# Patient Record
Sex: Female | Born: 1995 | Race: White | Hispanic: No | Marital: Single | Smoking: Former smoker
Health system: Southern US, Community
[De-identification: ages and names within clinical notes are randomized; demographics above are authoritative.]

## PROBLEM LIST (undated history)

## (undated) ENCOUNTER — Inpatient Hospital Stay (HOSPITAL_COMMUNITY): Payer: Self-pay

## (undated) DIAGNOSIS — F319 Bipolar disorder, unspecified: Secondary | ICD-10-CM

## (undated) DIAGNOSIS — F1210 Cannabis abuse, uncomplicated: Secondary | ICD-10-CM

## (undated) DIAGNOSIS — F909 Attention-deficit hyperactivity disorder, unspecified type: Secondary | ICD-10-CM

## (undated) DIAGNOSIS — F329 Major depressive disorder, single episode, unspecified: Secondary | ICD-10-CM

## (undated) DIAGNOSIS — R45851 Suicidal ideations: Secondary | ICD-10-CM

## (undated) DIAGNOSIS — F419 Anxiety disorder, unspecified: Secondary | ICD-10-CM

## (undated) DIAGNOSIS — F919 Conduct disorder, unspecified: Secondary | ICD-10-CM

## (undated) DIAGNOSIS — F938 Other childhood emotional disorders: Secondary | ICD-10-CM

## (undated) DIAGNOSIS — F32A Depression, unspecified: Secondary | ICD-10-CM

## (undated) DIAGNOSIS — N390 Urinary tract infection, site not specified: Secondary | ICD-10-CM

## (undated) DIAGNOSIS — E669 Obesity, unspecified: Secondary | ICD-10-CM

## (undated) HISTORY — PX: MOUTH SURGERY: SHX715

## (undated) HISTORY — PX: ABDOMINAL SURGERY: SHX537

---
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
## 2017-01-03 ENCOUNTER — Ambulatory Visit (HOSPITAL_COMMUNITY)
Admission: RE | Admit: 2017-01-03 | Discharge: 2017-01-03 | Disposition: A | Discharge status: Home / Self Care | Payer: Medicaid Other | Source: Ambulatory Visit | Attending: Obstetrics and Gynecology | Admitting: Obstetrics and Gynecology

## 2017-01-03 ENCOUNTER — Other Ambulatory Visit (HOSPITAL_COMMUNITY): Payer: Self-pay | Admitting: Obstetrics and Gynecology

## 2017-01-03 ENCOUNTER — Encounter (HOSPITAL_COMMUNITY): Payer: Self-pay

## 2017-01-03 DIAGNOSIS — O43123 Velamentous insertion of umbilical cord, third trimester: Secondary | ICD-10-CM | POA: Insufficient documentation

## 2017-01-03 DIAGNOSIS — O43122 Velamentous insertion of umbilical cord, second trimester: Secondary | ICD-10-CM

## 2017-01-03 DIAGNOSIS — Z363 Encounter for antenatal screening for malformations: Secondary | ICD-10-CM | POA: Insufficient documentation

## 2017-01-03 DIAGNOSIS — Z3A31 31 weeks gestation of pregnancy: Secondary | ICD-10-CM | POA: Diagnosis not present

## 2017-01-03 DIAGNOSIS — O99213 Obesity complicating pregnancy, third trimester: Secondary | ICD-10-CM | POA: Insufficient documentation

## 2017-01-04 ENCOUNTER — Other Ambulatory Visit (HOSPITAL_COMMUNITY): Payer: Self-pay | Admitting: *Deleted

## 2017-01-10 ENCOUNTER — Encounter: Payer: Self-pay | Admitting: Obstetrics and Gynecology

## 2017-01-10 NOTE — Progress Notes (Signed)
Patient did not keep OB referral appointment for 01/10/2017.  Cornelia Copa MD Attending Center for [employer] Midwife)

## 2017-01-11 ENCOUNTER — Encounter: Payer: Medicaid Other | Admitting: Obstetrics & Gynecology

## 2017-01-18 ENCOUNTER — Ambulatory Visit (HOSPITAL_COMMUNITY)
Admission: RE | Admit: 2017-01-18 | Discharge: 2017-01-18 | Disposition: A | Discharge status: Home / Self Care | Payer: Medicaid Other | Source: Ambulatory Visit | Attending: Obstetrics and Gynecology | Admitting: Obstetrics and Gynecology

## 2017-01-18 ENCOUNTER — Encounter (HOSPITAL_COMMUNITY): Payer: Self-pay

## 2017-01-18 ENCOUNTER — Other Ambulatory Visit (HOSPITAL_COMMUNITY): Payer: Self-pay | Admitting: Obstetrics and Gynecology

## 2017-01-18 DIAGNOSIS — O43123 Velamentous insertion of umbilical cord, third trimester: Secondary | ICD-10-CM | POA: Diagnosis present

## 2017-01-18 DIAGNOSIS — Z3A33 33 weeks gestation of pregnancy: Secondary | ICD-10-CM

## 2017-01-18 DIAGNOSIS — E669 Obesity, unspecified: Secondary | ICD-10-CM | POA: Insufficient documentation

## 2017-01-18 DIAGNOSIS — O99213 Obesity complicating pregnancy, third trimester: Secondary | ICD-10-CM | POA: Diagnosis not present

## 2017-02-19 ENCOUNTER — Ambulatory Visit (INDEPENDENT_AMBULATORY_CARE_PROVIDER_SITE_OTHER): Payer: Medicaid Other | Admitting: Obstetrics & Gynecology

## 2017-02-19 ENCOUNTER — Other Ambulatory Visit (HOSPITAL_COMMUNITY)
Admission: RE | Admit: 2017-02-19 | Discharge: 2017-02-19 | Disposition: A | Discharge status: Home / Self Care | Payer: Medicaid Other | Source: Ambulatory Visit | Attending: Obstetrics & Gynecology | Admitting: Obstetrics & Gynecology

## 2017-02-19 DIAGNOSIS — Z3403 Encounter for supervision of normal first pregnancy, third trimester: Secondary | ICD-10-CM | POA: Insufficient documentation

## 2017-02-19 DIAGNOSIS — Z3400 Encounter for supervision of normal first pregnancy, unspecified trimester: Secondary | ICD-10-CM | POA: Insufficient documentation

## 2017-02-19 LAB — POCT URINALYSIS DIP (DEVICE)
Bilirubin Urine: NEGATIVE
GLUCOSE, UA: NEGATIVE mg/dL
Hgb urine dipstick: NEGATIVE
Ketones, ur: NEGATIVE mg/dL
LEUKOCYTES UA: NEGATIVE
NITRITE: NEGATIVE
PROTEIN: 30 mg/dL — AB
Specific Gravity, Urine: 1.025 (ref 1.005–1.030)
UROBILINOGEN UA: 0.2 mg/dL (ref 0.0–1.0)
pH: 6.5 (ref 5.0–8.0)

## 2017-02-19 NOTE — Patient Instructions (Signed)
Vaginal Delivery Vaginal delivery means that you will give birth by pushing your baby out of your birth canal (vagina). A team of health care providers will help you before, during, and after vaginal delivery. Birth experiences are unique for every woman and every pregnancy, and birth experiences vary depending on where you choose to give birth. What should I do to prepare for my baby's birth? Before your baby is born, it is important to talk with your health care provider about:  Your labor and delivery preferences. These may include: ? Medicines that you may be given. ? How you will manage your pain. This might include non-medical pain relief techniques or injectable pain relief such as epidural analgesia. ? How you and your baby will be monitored during labor and delivery. ? Who may be in the labor and delivery room with you. ? Your feelings about surgical delivery of your baby (cesarean delivery, or C-section) if this becomes necessary. ? Your feelings about receiving donated blood through an IV tube (blood transfusion) if this becomes necessary.  Whether you are able: ? To take pictures or videos of the birth. ? To eat during labor and delivery. ? To move around, walk, or change positions during labor and delivery.  What to expect after your baby is born, such as: ? Whether delayed umbilical cord clamping and cutting is offered. ? Who will care for your baby right after birth. ? Medicines or tests that may be recommended for your baby. ? Whether breastfeeding is supported in your hospital or birth center. ? How long you will be in the hospital or birth center.  How any medical conditions you have may affect your baby or your labor and delivery experience.  To prepare for your baby's birth, you should also:  Attend all of your health care visits before delivery (prenatal visits) as recommended by your health care provider. This is important.  Prepare your home for your baby's  arrival. Make sure that you have: ? Diapers. ? Baby clothing. ? Feeding equipment. ? Safe sleeping arrangements for you and your baby.  Install a car seat in your vehicle. Have your car seat checked by a certified car seat installer to make sure that it is installed safely.  Think about who will help you with your new baby at home for at least the first several weeks after delivery.  What can I expect when I arrive at the birth center or hospital? Once you are in labor and have been admitted into the hospital or birth center, your health care provider may:  Review your pregnancy history and any concerns you have.  Insert an IV tube into one of your veins. This is used to give you fluids and medicines.  Check your blood pressure, pulse, temperature, and heart rate (vital signs).  Check whether your bag of water (amniotic sac) has broken (ruptured).  Talk with you about your birth plan and discuss pain control options.  Monitoring Your health care provider may monitor your contractions (uterine monitoring) and your baby's heart rate (fetal monitoring). You may need to be monitored:  Often, but not continuously (intermittently).  All the time or for long periods at a time (continuously). Continuous monitoring may be needed if: ? You are taking certain medicines, such as medicine to relieve pain or make your contractions stronger. ? You have pregnancy or labor complications.  Monitoring may be done by:  Placing a special stethoscope or a handheld monitoring device on your abdomen to   check your baby's heartbeat, and feeling your abdomen for contractions. This method of monitoring does not continuously record your baby's heartbeat or your contractions.  Placing monitors on your abdomen (external monitors) to record your baby's heartbeat and the frequency and length of contractions. You may not have to wear external monitors all the time.  Placing monitors inside of your uterus  (internal monitors) to record your baby's heartbeat and the frequency, length, and strength of your contractions. ? Your health care provider may use internal monitors if he or she needs more information about the strength of your contractions or your baby's heart rate. ? Internal monitors are put in place by passing a thin, flexible wire through your vagina and into your uterus. Depending on the type of monitor, it may remain in your uterus or on your baby's head until birth. ? Your health care provider will discuss the benefits and risks of internal monitoring with you and will ask for your permission before inserting the monitors.  Telemetry. This is a type of continuous monitoring that can be done with external or internal monitors. Instead of having to stay in bed, you are able to move around during telemetry. Ask your health care provider if telemetry is an option for you.  Physical exam Your health care provider may perform a physical exam. This may include:  Checking whether your baby is positioned: ? With the head toward your vagina (head-down). This is most common. ? With the head toward the top of your uterus (head-up or breech). If your baby is in a breech position, your health care provider may try to turn your baby to a head-down position so you can deliver vaginally. If it does not seem that your baby can be born vaginally, your provider may recommend surgery to deliver your baby. In rare cases, you may be able to deliver vaginally if your baby is head-up (breech delivery). ? Lying sideways (transverse). Babies that are lying sideways cannot be delivered vaginally.  Checking your cervix to determine: ? Whether it is thinning out (effacing). ? Whether it is opening up (dilating). ? How low your baby has moved into your birth canal.  What are the three stages of labor and delivery?  Normal labor and delivery is divided into the following three stages: Stage 1  Stage 1 is the  longest stage of labor, and it can last for hours or days. Stage 1 includes: ? Early labor. This is when contractions may be irregular, or regular and mild. Generally, early labor contractions are more than 10 minutes apart. ? Active labor. This is when contractions get longer, more regular, more frequent, and more intense. ? The transition phase. This is when contractions happen very close together, are very intense, and may last longer than during any other part of labor.  Contractions generally feel mild, infrequent, and irregular at first. They get stronger, more frequent (about every 2-3 minutes), and more regular as you progress from early labor through active labor and transition.  Many women progress through stage 1 naturally, but you may need help to continue making progress. If this happens, your health care provider may talk with you about: ? Rupturing your amniotic sac if it has not ruptured yet. ? Giving you medicine to help make your contractions stronger and more frequent.  Stage 1 ends when your cervix is completely dilated to 4 inches (10 cm) and completely effaced. This happens at the end of the transition phase. Stage 2  Once   your cervix is completely effaced and dilated to 4 inches (10 cm), you may start to feel an urge to push. It is common for the body to naturally take a rest before feeling the urge to push, especially if you received an epidural or certain other pain medicines. This rest period may last for up to 1-2 hours, depending on your unique labor experience.  During stage 2, contractions are generally less painful, because pushing helps relieve contraction pain. Instead of contraction pain, you may feel stretching and burning pain, especially when the widest part of your baby's head passes through the vaginal opening (crowning).  Your health care provider will closely monitor your pushing progress and your baby's progress through the vagina during stage 2.  Your  health care provider may massage the area of skin between your vaginal opening and anus (perineum) or apply warm compresses to your perineum. This helps it stretch as the baby's head starts to crown, which can help prevent perineal tearing. ? In some cases, an incision may be made in your perineum (episiotomy) to allow the baby to pass through the vaginal opening. An episiotomy helps to make the opening of the vagina larger to allow more room for the baby to fit through.  It is very important to breathe and focus so your health care provider can control the delivery of your baby's head. Your health care provider may have you decrease the intensity of your pushing, to help prevent perineal tearing.  After delivery of your baby's head, the shoulders and the rest of the body generally deliver very quickly and without difficulty.  Once your baby is delivered, the umbilical cord may be cut right away, or this may be delayed for 1-2 minutes, depending on your baby's health. This may vary among health care providers, hospitals, and birth centers.  If you and your baby are healthy enough, your baby may be placed on your chest or abdomen to help maintain the baby's temperature and to help you bond with each other. Some mothers and babies start breastfeeding at this time. Your health care team will dry your baby and help keep your baby warm during this time.  Your baby may need immediate care if he or she: ? Showed signs of distress during labor. ? Has a medical condition. ? Was born too early (prematurely). ? Had a bowel movement before birth (meconium). ? Shows signs of difficulty transitioning from being inside the uterus to being outside of the uterus. If you are planning to breastfeed, your health care team will help you begin a feeding. Stage 3  The third stage of labor starts immediately after the birth of your baby and ends after you deliver the placenta. The placenta is an organ that develops  during pregnancy to provide oxygen and nutrients to your baby in the womb.  Delivering the placenta may require some pushing, and you may have mild contractions. Breastfeeding can stimulate contractions to help you deliver the placenta.  After the placenta is delivered, your uterus should tighten (contract) and become firm. This helps to stop bleeding in your uterus. To help your uterus contract and to control bleeding, your health care provider may: ? Give you medicine by injection, through an IV tube, by mouth, or through your rectum (rectally). ? Massage your abdomen or perform a vaginal exam to remove any blood clots that are left in your uterus. ? Empty your bladder by placing a thin, flexible tube (catheter) into your bladder. ? Encourage   you to breastfeed your baby. After labor is over, you and your baby will be monitored closely to ensure that you are both healthy until you are ready to go home. Your health care team will teach you how to care for yourself and your baby. This information is not intended to replace advice given to you by your health care provider. Make sure you discuss any questions you have with your health care provider. Document Released: 06/06/2008 Document Revised: 03/17/2016 Document Reviewed: 09/12/2015 Elsevier Interactive Patient Education  2018 Elsevier Inc.  

## 2017-02-20 ENCOUNTER — Encounter: Payer: Self-pay | Admitting: *Deleted

## 2017-02-20 LAB — CERVICOVAGINAL ANCILLARY ONLY
CHLAMYDIA, DNA PROBE: NEGATIVE
Neisseria Gonorrhea: NEGATIVE

## 2017-02-20 LAB — GLUCOSE TOLERANCE, 1 HOUR: Glucose, 1 Hour GTT: 105

## 2017-02-20 LAB — OB RESULTS CONSOLE GBS: STREP GROUP B AG: NEGATIVE

## 2017-02-21 LAB — STREP GP B NAA: Strep Gp B NAA: NEGATIVE

## 2017-02-21 NOTE — Progress Notes (Signed)
Subjective:    Denise Mclaughlin is a 21 y.o. G1P0 3913w5d being seen today for her obstetrical visit. She transfers  After dismissal from another practicePatient reports no complaints. Fetal movement: normal.  Objective:    BP 136/79   Pulse (!) 118   Wt 294 lb 11.2 oz (133.7 kg)   LMP 05/26/2016   Physical Exam  Exam  FHT: Fetal Heart Rate (bpm): 152  Uterine Size: Fundal Height: 36 cm  Presentation:  vtx     Assessment:    Pregnancy:  G1P0    Plan:    Patient Active Problem List   Diagnosis Date Noted  . Supervision of normal first pregnancy, antepartum 02/19/2017  . 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 Suicidal ideation 05/21/2015  . Bipolar and related disorder (HCC) 05/21/2015  . Obesity 05/21/2015  . Cannabis abuse, continuous use 05/21/2015  . Suicidal behavior   . Major depressive disorder, single episode, severe without psychotic features (HCC)   . Acetaminophen overdose 05/18/2015      Follow up in 1 Week.    Adam PhenixArnold, Yazmina Pareja G, MD

## 2017-02-26 ENCOUNTER — Ambulatory Visit (INDEPENDENT_AMBULATORY_CARE_PROVIDER_SITE_OTHER): Payer: Medicaid Other | Admitting: Advanced Practice Midwife

## 2017-02-26 ENCOUNTER — Encounter: Payer: Self-pay | Admitting: Advanced Practice Midwife

## 2017-02-26 VITALS — BP 132/83 | HR 98 | Wt 295.7 lb

## 2017-02-26 DIAGNOSIS — O480 Post-term pregnancy: Secondary | ICD-10-CM

## 2017-02-26 DIAGNOSIS — Z3400 Encounter for supervision of normal first pregnancy, unspecified trimester: Secondary | ICD-10-CM

## 2017-02-26 DIAGNOSIS — Z3483 Encounter for supervision of other normal pregnancy, third trimester: Secondary | ICD-10-CM

## 2017-02-26 NOTE — Patient Instructions (Addendum)
Braxton Hicks Contractions Contractions of the uterus can occur throughout pregnancy, but they are not always a sign that you are in labor. You may have practice contractions called Braxton Hicks contractions. These false labor contractions are sometimes confused with true labor. What are Braxton Hicks contractions? Braxton Hicks contractions are tightening movements that occur in the muscles of the uterus before labor. Unlike true labor contractions, these contractions do not result in opening (dilation) and thinning of the cervix. Toward the end of pregnancy (32-34 weeks), Braxton Hicks contractions can happen more often and may become stronger. These contractions are sometimes difficult to tell apart from true labor because they can be very uncomfortable. You should not feel embarrassed if you go to the hospital with false labor. Sometimes, the only way to tell if you are in true labor is for your health care provider to look for changes in the cervix. The health care provider will do a physical exam and may monitor your contractions. If you are not in true labor, the exam should show that your cervix is not dilating and your water has not broken. If there are no prenatal problems or other health problems associated with your pregnancy, it is completely safe for you to be sent home with false labor. You may continue to have Braxton Hicks contractions until you go into true labor. How can I tell the difference between true labor and false labor?  Differences ? False labor ? Contractions last 30-70 seconds.: Contractions are usually shorter and not as strong as true labor contractions. ? Contractions become very regular.: Contractions are usually irregular. ? Discomfort is usually felt in the top of the uterus, and it spreads to the lower abdomen and low back.: Contractions are often felt in the front of the lower abdomen and in the groin. ? Contractions do not go away with walking.: Contractions may  go away when you walk around or change positions while lying down. ? Contractions usually become more intense and increase in frequency.: Contractions get weaker and are shorter-lasting as time goes on. ? The cervix dilates and gets thinner.: The cervix usually does not dilate or become thin. Follow these instructions at home:  Take over-the-counter and prescription medicines only as told by your health care provider.  Keep up with your usual exercises and follow other instructions from your health care provider.  Eat and drink lightly if you think you are going into labor.  If Braxton Hicks contractions are making you uncomfortable: ? Change your position from lying down or resting to walking, or change from walking to resting. ? Sit and rest in a tub of warm water. ? Drink enough fluid to keep your urine clear or pale yellow. Dehydration may cause these contractions. ? Do slow and deep breathing several times an hour.  Keep all follow-up prenatal visits as told by your health care provider. This is important. Contact a health care provider if:  You have a fever.  You have continuous pain in your abdomen. Get help right away if:  Your contractions become stronger, more regular, and closer together.  You have fluid leaking or gushing from your vagina.  You pass blood-tinged mucus (bloody show).  You have bleeding from your vagina.  You have low back pain that you never had before.  You feel your baby's head pushing down and causing pelvic pressure.  Your baby is not moving inside you as much as it used to. Summary  Contractions that occur before labor are   called Braxton Hicks contractions, false labor, or practice contractions.  Braxton Hicks contractions are usually shorter, weaker, farther apart, and less regular than true labor contractions. True labor contractions usually become progressively stronger and regular and they become more frequent.  Manage discomfort from  Braxton Hicks contractions by changing position, resting in a warm bath, drinking plenty of water, or practicing deep breathing. This information is not intended to replace advice given to you by your health care provider. Make sure you discuss any questions you have with your health care provider. Document Released: 08/28/2005 Document Revised: 07/17/2016 Document Reviewed: 07/17/2016 Elsevier Interactive Patient Education  2017 Elsevier Inc.   AREA PEDIATRIC/FAMILY PRACTICE PHYSICIANS  [facility] [address], [address] [place], [place]  [address] Phone - [phone]   Fax - [phone]  [facility] [address] [place], [place] [address] Phone - [phone]   Fax - [phone]  [facility] [address] [place], [place]  [address] Phone - [phone]   Fax - [phone]  [facility] [address] [place], [place]  [address] Phone - [phone]   Fax - [phone]  [facility] [address] [place], [place]  [address] Phone - [phone]   Fax - [phone]  [facility] [address] [place], [place]  [address] Phone - [phone]   Fax - [phone]  [facility] [address] [place], [place]  [address] Phone - [phone]   Fax - [phone]  [facility] [address] [place], [place]  [address] Phone - [phone]   Fax - [phone]  [facility] [address] [place], [place]  [address] Phone - [phone]   Fax - [phone] [facility] [address] [place], [place]  [address] Phone - [phone]   Fax - [phone]  [facility] [address] [place], [place]  [address] Phone - [phone]   Fax - [phone]  [facility] [address] [place], [place]  [address] Phone - [phone]   Fax - [phone]  [facility] [address] [place], [place]  [address] Phone - [phone]   Fax - [phone]  [facility] [address] [place], [place]  [address] Phone - [phone]  [facility] [address] [place], [place]  [address] Phone - [phone]   Fax - [phone]  [facility] [address] [place], [place]  [address] Phone - [phone]   Fax - [phone]  [facility] [address] [place], [place]  [address] Phone - [phone]   Fax - [phone]  [facility] [address] [place], [place]  [address] Phone - [phone]   Fax - [phone]   [facility] [address] [place], [place]  [address] Phone - [phone]   Fax - [phone]  [facility] [address] [place], [place]  [address] Phone - [phone]   Fax - [phone]  [facility] [address] [place], [place]  [address] Phone - [phone]   Fax - [phone]  [facility] [address] [place], [place]  [address] Phone - [phone]   Fax - [phone]  [facility] [address] ,   [place]  [address] Phone - [phone]   Fax - [phone] [facility] [address] [place], [place]  [address] Phone - [phone]   Fax - [phone]  [facility] [address] [place], [place]  [address] Phone - [phone]   Fax - [phone]  [facility] [address] [place], [place]  [address] Phone - [phone]   Fax - [phone]  [facility] Charlene Flemming MD [address] [place] [address] Phone [phone]  Fax [phone]   

## 2017-02-26 NOTE — Progress Notes (Signed)
   PRENATAL VISIT NOTE  Subjective:  Denise Mclaughlin is a 21 y.o. G1P0 at 6749w3d being seen today for ongoing prenatal care.  She is currently monitored for the following issues for this low-risk pregnancy and has Acetaminophen overdose; Suicidal behavior; Major depressive disorder, single episode, severe without psychotic features (HCC); Anxiety disorder of adolescence; Suicidal ideation; Bipolar and related disorder (HCC); Obesity; Cannabis abuse, continuous use; and Supervision of normal first pregnancy, antepartum on her problem list.  Patient reports no complaints.  Contractions: Irritability. Vag. Bleeding: None.  Movement: Present. Denies leaking of fluid.   The following portions of the patient's history were reviewed and updated as appropriate: allergies, current medications, past family history, past medical history, past social history, past surgical history and problem list. Problem list updated.  Objective:   Vitals:   02/26/17 1340  BP: 132/83  Pulse: 98  Weight: 295 lb 11.2 oz (134.1 kg)    Fetal Status: Fetal Heart Rate (bpm): 140   Movement: Present     General:  Alert, oriented and cooperative. Patient is in no acute distress.  Skin: Skin is warm and dry. No rash noted.   Cardiovascular: Normal heart rate noted  Respiratory: Normal respiratory effort, no problems with respiration noted  Abdomen: Soft, gravid, appropriate for gestational age. Pain/Pressure: Present     Pelvic:  Cervical exam performed        Extremities: Normal range of motion.  Edema: Trace  Mental Status: Normal mood and affect. Normal behavior. Normal judgment and thought content.   Assessment and Plan:  Pregnancy: G1P0 at 3549w3d  1. Supervision of normal first pregnancy, antepartum   2. Post term pregnancy, antepartum condition or complication  - US MFM FETAL BPP WO NON STRESS; Future  Term labor symptoms and general obstetric precautions including but not limited to vaginal bleeding,  contractions, leaking of fluid and fetal movement were reviewed in detail with the patient. Please refer to After Visit Summary for other counseling recommendations.  Return in about 1 week (around 03/05/2017) for ROB/NST/AFI.   Dorathy KinsmanVirginia Thanos [place], CNM

## 2017-03-05 ENCOUNTER — Other Ambulatory Visit: Payer: Medicaid Other | Admitting: Obstetrics and Gynecology

## 2017-03-06 ENCOUNTER — Encounter (HOSPITAL_COMMUNITY): Payer: Self-pay

## 2017-03-06 ENCOUNTER — Ambulatory Visit (INDEPENDENT_AMBULATORY_CARE_PROVIDER_SITE_OTHER): Payer: Medicaid Other | Admitting: Obstetrics and Gynecology

## 2017-03-06 ENCOUNTER — Ambulatory Visit (HOSPITAL_COMMUNITY)
Admission: RE | Admit: 2017-03-06 | Discharge: 2017-03-06 | Disposition: A | Discharge status: Home / Self Care | Payer: Medicaid Other | Source: Ambulatory Visit | Attending: Advanced Practice Midwife | Admitting: Advanced Practice Midwife

## 2017-03-06 ENCOUNTER — Other Ambulatory Visit: Payer: Self-pay | Admitting: Advanced Practice Midwife

## 2017-03-06 VITALS — BP 135/77 | HR 108 | Wt 300.0 lb

## 2017-03-06 DIAGNOSIS — O480 Post-term pregnancy: Secondary | ICD-10-CM | POA: Diagnosis not present

## 2017-03-06 DIAGNOSIS — Z3A40 40 weeks gestation of pregnancy: Secondary | ICD-10-CM | POA: Insufficient documentation

## 2017-03-06 DIAGNOSIS — Z3400 Encounter for supervision of normal first pregnancy, unspecified trimester: Secondary | ICD-10-CM

## 2017-03-06 DIAGNOSIS — Z3403 Encounter for supervision of normal first pregnancy, third trimester: Secondary | ICD-10-CM

## 2017-03-06 NOTE — Patient Instructions (Signed)
Third Trimester of Pregnancy The third trimester is from week 28 through week 40 (months 7 through 9). The third trimester is a time when the unborn baby (fetus) is growing rapidly. At the end of the ninth month, the fetus is about 20 inches in length and weighs 6-10 pounds. Body changes during your third trimester Your body will continue to go through many changes during pregnancy. The changes vary from woman to woman. During the third trimester:  Your weight will continue to increase. You can expect to gain 25-35 pounds (11-16 kg) by the end of the pregnancy.  You may begin to get stretch marks on your hips, abdomen, and breasts.  You may urinate more often because the fetus is moving lower into your pelvis and pressing on your bladder.  You may develop or continue to have heartburn. This is caused by increased hormones that slow down muscles in the digestive tract.  You may develop or continue to have constipation because increased hormones slow digestion and cause the muscles that push waste through your intestines to relax.  You may develop hemorrhoids. These are swollen veins (varicose veins) in the rectum that can itch or be painful.  You may develop swollen, bulging veins (varicose veins) in your legs.  You may have increased body aches in the pelvis, back, or thighs. This is due to weight gain and increased hormones that are relaxing your joints.  You may have changes in your hair. These can include thickening of your hair, rapid growth, and changes in texture. Some women also have hair loss during or after pregnancy, or hair that feels dry or thin. Your hair will most likely return to normal after your baby is born.  Your breasts will continue to grow and they will continue to become tender. A yellow fluid (colostrum) may leak from your breasts. This is the first milk you are producing for your baby.  Your belly button may stick out.  You may notice more swelling in your hands,  face, or ankles.  You may have increased tingling or numbness in your hands, arms, and legs. The skin on your belly may also feel numb.  You may feel short of breath because of your expanding uterus.  You may have more problems sleeping. This can be caused by the size of your belly, increased need to urinate, and an increase in your body's metabolism.  You may notice the fetus "dropping," or moving lower in your abdomen (lightening).  You may have increased vaginal discharge.  You may notice your joints feel loose and you may have pain around your pelvic bone.  What to expect at prenatal visits You will have prenatal exams every 2 weeks until week 36. Then you will have weekly prenatal exams. During a routine prenatal visit:  You will be weighed to make sure you and the baby are growing normally.  Your blood pressure will be taken.  Your abdomen will be measured to track your baby's growth.  The fetal heartbeat will be listened to.  Any test results from the previous visit will be discussed.  You may have a cervical check near your due date to see if your cervix has softened or thinned (effaced).  You will be tested for Group B streptococcus. This happens between 35 and 37 weeks.  Your health care provider may ask you:  What your birth plan is.  How you are feeling.  If you are feeling the baby move.  If you have had   any abnormal symptoms, such as leaking fluid, bleeding, severe headaches, or abdominal cramping.  If you are using any tobacco products, including cigarettes, chewing tobacco, and electronic cigarettes.  If you have any questions.  Other tests or screenings that may be performed during your third trimester include:  Blood tests that check for low iron levels (anemia).  Fetal testing to check the health, activity level, and growth of the fetus. Testing is done if you have certain medical conditions or if there are problems during the  pregnancy.  Nonstress test (NST). This test checks the health of your baby to make sure there are no signs of problems, such as the baby not getting enough oxygen. During this test, a belt is placed around your belly. The baby is made to move, and its heart rate is monitored during movement.  What is false labor? False labor is a condition in which you feel small, irregular tightenings of the muscles in the womb (contractions) that usually go away with rest, changing position, or drinking water. These are called Braxton Hicks contractions. Contractions may last for hours, days, or even weeks before true labor sets in. If contractions come at regular intervals, become more frequent, increase in intensity, or become painful, you should see your health care provider. What are the signs of labor?  Abdominal cramps.  Regular contractions that start at 10 minutes apart and become stronger and more frequent with time.  Contractions that start on the top of the uterus and spread down to the lower abdomen and back.  Increased pelvic pressure and dull back pain.  A watery or bloody mucus discharge that comes from the vagina.  Leaking of amniotic fluid. This is also known as your "water breaking." It could be a slow trickle or a gush. Let your health care provider know if it has a color or strange odor. If you have any of these signs, call your health care provider right away, even if it is before your due date. Follow these instructions at home: Medicines  Follow your health care provider's instructions regarding medicine use. Specific medicines may be either safe or unsafe to take during pregnancy.  Take a prenatal vitamin that contains at least 600 micrograms (mcg) of folic acid.  If you develop constipation, try taking a stool softener if your health care provider approves. Eating and drinking  Eat a balanced diet that includes fresh fruits and vegetables, whole grains, good sources of protein  such as meat, eggs, or tofu, and low-fat dairy. Your health care provider will help you determine the amount of weight gain that is right for you.  Avoid raw meat and uncooked cheese. These carry germs that can cause birth defects in the baby.  If you have low calcium intake from food, talk to your health care provider about whether you should take a daily calcium supplement.  Eat four or five small meals rather than three large meals a day.  Limit foods that are high in fat and processed sugars, such as fried and sweet foods.  To prevent constipation: ? Drink enough fluid to keep your urine clear or pale yellow. ? Eat foods that are high in fiber, such as fresh fruits and vegetables, whole grains, and beans. Activity  Exercise only as directed by your health care provider. Most women can continue their usual exercise routine during pregnancy. Try to exercise for 30 minutes at least 5 days a week. Stop exercising if you experience uterine contractions.  Avoid heavy   lifting.  Do not exercise in extreme heat or humidity, or at high altitudes.  Wear low-heel, comfortable shoes.  Practice good posture.  You may continue to have sex unless your health care provider tells you otherwise. Relieving pain and discomfort  Take frequent breaks and rest with your legs elevated if you have leg cramps or low back pain.  Take warm sitz baths to soothe any pain or discomfort caused by hemorrhoids. Use hemorrhoid cream if your health care provider approves.  Wear a good support bra to prevent discomfort from breast tenderness.  If you develop varicose veins: ? Wear support pantyhose or compression stockings as told by your healthcare provider. ? Elevate your feet for 15 minutes, 3-4 times a day. Prenatal care  Write down your questions. Take them to your prenatal visits.  Keep all your prenatal visits as told by your health care provider. This is important. Safety  Wear your seat belt at  all times when driving.  Make a list of emergency phone numbers, including numbers for family, friends, the hospital, and police and fire departments. General instructions  Avoid cat litter boxes and soil used by cats. These carry germs that can cause birth defects in the baby. If you have a cat, ask someone to clean the litter box for you.  Do not travel far distances unless it is absolutely necessary and only with the approval of your health care provider.  Do not use hot tubs, steam rooms, or saunas.  Do not drink alcohol.  Do not use any products that contain nicotine or tobacco, such as cigarettes and e-cigarettes. If you need help quitting, ask your health care provider.  Do not use any medicinal herbs or unprescribed drugs. These chemicals affect the formation and growth of the baby.  Do not douche or use tampons or scented sanitary pads.  Do not cross your legs for long periods of time.  To prepare for the arrival of your baby: ? Take prenatal classes to understand, practice, and ask questions about labor and delivery. ? Make a trial run to the hospital. ? Visit the hospital and tour the maternity area. ? Arrange for maternity or paternity leave through employers. ? Arrange for family and friends to take care of pets while you are in the hospital. ? Purchase a rear-facing car seat and make sure you know how to install it in your car. ? Pack your hospital bag. ? Prepare the baby's nursery. Make sure to remove all pillows and stuffed animals from the baby's crib to prevent suffocation.  Visit your dentist if you have not gone during your pregnancy. Use a soft toothbrush to brush your teeth and be gentle when you floss. Contact a health care provider if:  You are unsure if you are in labor or if your water has broken.  You become dizzy.  You have mild pelvic cramps, pelvic pressure, or nagging pain in your abdominal area.  You have lower back pain.  You have persistent  nausea, vomiting, or diarrhea.  You have an unusual or bad smelling vaginal discharge.  You have pain when you urinate. Get help right away if:  Your water breaks before 37 weeks.  You have regular contractions less than 5 minutes apart before 37 weeks.  You have a fever.  You are leaking fluid from your vagina.  You have spotting or bleeding from your vagina.  You have severe abdominal pain or cramping.  You have rapid weight loss or weight gain.    You have shortness of breath with chest pain.  You notice sudden or extreme swelling of your face, hands, ankles, feet, or legs.  Your baby makes fewer than 10 movements in 2 hours.  You have severe headaches that do not go away when you take medicine.  You have vision changes. Summary  The third trimester is from week 28 through week 40, months 7 through 9. The third trimester is a time when the unborn baby (fetus) is growing rapidly.  During the third trimester, your discomfort may increase as you and your baby continue to gain weight. You may have abdominal, leg, and back pain, sleeping problems, and an increased need to urinate.  During the third trimester your breasts will keep growing and they will continue to become tender. A yellow fluid (colostrum) may leak from your breasts. This is the first milk you are producing for your baby.  False labor is a condition in which you feel small, irregular tightenings of the muscles in the womb (contractions) that eventually go away. These are called Braxton Hicks contractions. Contractions may last for hours, days, or even weeks before true labor sets in.  Signs of labor can include: abdominal cramps; regular contractions that start at 10 minutes apart and become stronger and more frequent with time; watery or bloody mucus discharge that comes from the vagina; increased pelvic pressure and dull back pain; and leaking of amniotic fluid. This information is not intended to replace advice  given to you by your health care provider. Make sure you discuss any questions you have with your health care provider. Document Released: 08/22/2001 Document Revised: 02/03/2016 Document Reviewed: 10/29/2012 Elsevier Interactive Patient Education  2017 [employer]  

## 2017-03-06 NOTE — Progress Notes (Signed)
   PRENATAL VISIT NOTE  Subjective:  Denise Mclaughlin is a 21 y.o. G1P0 at 9343w4d being seen today for ongoing prenatal care.  She is currently monitored for the following issues for this low-risk pregnancy and has Acetaminophen overdose; Suicidal behavior; Major depressive disorder, single episode, severe without psychotic features (HCC); Anxiety disorder of adolescence; Suicidal ideation; Bipolar and related disorder (HCC); Obesity; Cannabis abuse, continuous use; and Supervision of normal first pregnancy, antepartum on her problem list.  Patient reports no complaints.  Contractions: Irritability. Vag. Bleeding: None.  Movement: Present. Denies leaking of fluid.   The following portions of the patient's history were reviewed and updated as appropriate: allergies, current medications, past family history, past medical history, past social history, past surgical history and problem list. Problem list updated.  Objective:   Vitals:   03/06/17 1525  BP: 135/77  Pulse: (!) 108  Weight: 300 lb (136.1 kg)    Fetal Status: Fetal Heart Rate (bpm): NST   Movement: Present     General:  Alert, oriented and cooperative. Patient is in no acute distress.  Skin: Skin is warm and dry. No rash noted.   Cardiovascular: Normal heart rate noted  Respiratory: Normal respiratory effort, no problems with respiration noted  Abdomen: Soft, gravid, appropriate for gestational age. Pain/Pressure: Present     Pelvic:  Cervical exam deferred        Extremities: Normal range of motion.  Edema: Mild pitting, slight indentation  Mental Status: Normal mood and affect. Normal behavior. Normal judgment and thought content.   Assessment and Plan:  Pregnancy: G1P0 at 7643w4d  1. Supervision of normal first pregnancy, antepartum -normal pregnacy up todadate -set for direct admission at 9AM at 41 wks. No available induction slot until 41 and 6.  2. Post-term pregnancy, 40-42 weeks of gestation - Fetal nonstress test  baseline 155/mod var/ reactive no decels or contractions.  Term labor symptoms and general obstetric precautions including but not limited to vaginal bleeding, contractions, leaking of fluid and fetal movement were reviewed in detail with the patient. Please refer to After Visit Summary for other counseling recommendations.  No Follow-up on file.   Ernestina PennaNicholas Schenk, MD

## 2017-03-09 ENCOUNTER — Encounter (HOSPITAL_COMMUNITY): Payer: Self-pay | Admitting: *Deleted

## 2017-03-09 ENCOUNTER — Inpatient Hospital Stay (HOSPITAL_COMMUNITY)
Admission: AD | Admit: 2017-03-09 | Discharge: 2017-03-12 | DRG: 775 | Disposition: A | Discharge status: Home / Self Care | Payer: Medicaid Other | Source: Ambulatory Visit | Attending: Obstetrics and Gynecology | Admitting: Obstetrics and Gynecology

## 2017-03-09 ENCOUNTER — Inpatient Hospital Stay (HOSPITAL_COMMUNITY): Payer: Medicaid Other | Admitting: Anesthesiology

## 2017-03-09 DIAGNOSIS — O480 Post-term pregnancy: Principal | ICD-10-CM | POA: Diagnosis present

## 2017-03-09 DIAGNOSIS — O99214 Obesity complicating childbirth: Secondary | ICD-10-CM | POA: Diagnosis present

## 2017-03-09 DIAGNOSIS — Z68.41 Body mass index (BMI) pediatric, 85th percentile to less than 95th percentile for age: Secondary | ICD-10-CM | POA: Diagnosis not present

## 2017-03-09 DIAGNOSIS — Z3490 Encounter for supervision of normal pregnancy, unspecified, unspecified trimester: Secondary | ICD-10-CM

## 2017-03-09 DIAGNOSIS — Z3A41 41 weeks gestation of pregnancy: Secondary | ICD-10-CM

## 2017-03-09 DIAGNOSIS — O43123 Velamentous insertion of umbilical cord, third trimester: Secondary | ICD-10-CM | POA: Diagnosis present

## 2017-03-09 DIAGNOSIS — Z87891 Personal history of nicotine dependence: Secondary | ICD-10-CM | POA: Diagnosis not present

## 2017-03-09 LAB — TYPE AND SCREEN
ABO/RH(D): A POS
ANTIBODY SCREEN: NEGATIVE

## 2017-03-09 LAB — CBC
HCT: 31.9 % — ABNORMAL LOW (ref 36.0–46.0)
HEMATOCRIT: 34.8 % — AB (ref 36.0–46.0)
HEMOGLOBIN: 11.1 g/dL — AB (ref 12.0–15.0)
HEMOGLOBIN: 10.6 g/dL — AB (ref 12.0–15.0)
MCH: 25.6 pg — ABNORMAL LOW (ref 26.0–34.0)
MCH: 26.4 pg (ref 26.0–34.0)
MCHC: 31.9 g/dL (ref 30.0–36.0)
MCHC: 33.2 g/dL (ref 30.0–36.0)
MCV: 80.4 fL (ref 78.0–100.0)
MCV: 79.4 fL (ref 78.0–100.0)
PLATELETS: 168 10*3/uL (ref 150–400)
Platelets: 185 10*3/uL (ref 150–400)
RBC: 4.33 MIL/uL (ref 3.87–5.11)
RBC: 4.02 MIL/uL (ref 3.87–5.11)
RDW: 14.8 % (ref 11.5–15.5)
RDW: 14.7 % (ref 11.5–15.5)
WBC: 12 10*3/uL — AB (ref 4.0–10.5)
WBC: 10.8 10*3/uL — AB (ref 4.0–10.5)

## 2017-03-09 LAB — COMPREHENSIVE METABOLIC PANEL
ALK PHOS: 281 U/L — AB (ref 38–126)
ALT: 12 U/L — AB (ref 14–54)
ANION GAP: 6 (ref 5–15)
AST: 17 U/L (ref 15–41)
Albumin: 2.3 g/dL — ABNORMAL LOW (ref 3.5–5.0)
BUN: 10 mg/dL (ref 6–20)
CALCIUM: 8.5 mg/dL — AB (ref 8.9–10.3)
CHLORIDE: 109 mmol/L (ref 101–111)
CO2: 21 mmol/L — AB (ref 22–32)
CREATININE: 0.51 mg/dL (ref 0.44–1.00)
GFR calc Af Amer: >60 mL/min (ref >60)
GFR calc non Af Amer: >60 mL/min (ref >60)
GLUCOSE: 103 mg/dL — AB (ref 65–99)
Potassium: 4.1 mmol/L (ref 3.5–5.1)
SODIUM: 136 mmol/L (ref 135–145)
Total Bilirubin: 0.2 mg/dL — ABNORMAL LOW (ref 0.3–1.2)
Total Protein: 5.5 g/dL — ABNORMAL LOW (ref 6.5–8.1)

## 2017-03-09 LAB — PROTEIN / CREATININE RATIO, URINE
Creatinine, Urine: 136 mg/dL
PROTEIN CREATININE RATIO: 0.45 mg/mg{creat} — AB (ref 0.00–0.15)
TOTAL PROTEIN, URINE: 61 mg/dL

## 2017-03-09 LAB — ABO/RH: ABO/RH(D): A POS

## 2017-03-09 MED ORDER — DIPHENHYDRAMINE HCL 50 MG/ML IJ SOLN: 12.5000 mg | INTRAMUSCULAR | Status: DC | PRN

## 2017-03-09 MED ORDER — LACTATED RINGERS IV SOLN: 500.0000 mL | INTRAVENOUS | Status: DC | PRN

## 2017-03-09 MED ORDER — OXYTOCIN BOLUS FROM INFUSION
500.0000 mL | Freq: Once | INTRAVENOUS | Status: AC
  Administered 2017-03-10: 500 mL via INTRAVENOUS

## 2017-03-09 MED ORDER — OXYCODONE-ACETAMINOPHEN 5-325 MG PO TABS: 2.0000 | ORAL_TABLET | ORAL | Status: DC | PRN

## 2017-03-09 MED ORDER — PHENYLEPHRINE 40 MCG/ML (10ML) SYRINGE FOR IV PUSH (FOR BLOOD PRESSURE SUPPORT)
80.0000 ug | PREFILLED_SYRINGE | INTRAVENOUS | Status: DC | PRN
  Filled 2017-03-09: qty 5

## 2017-03-09 MED ORDER — SOD CITRATE-CITRIC ACID 500-334 MG/5ML PO SOLN: 30.0000 mL | ORAL | Status: DC | PRN

## 2017-03-09 MED ORDER — OXYCODONE-ACETAMINOPHEN 5-325 MG PO TABS: 1.0000 | ORAL_TABLET | ORAL | Status: DC | PRN

## 2017-03-09 MED ORDER — LACTATED RINGERS IV SOLN
500.0000 mL | Freq: Once | INTRAVENOUS | Status: AC
  Administered 2017-03-09: 500 mL via INTRAVENOUS

## 2017-03-09 MED ORDER — OXYTOCIN 40 UNITS IN LACTATED RINGERS INFUSION - SIMPLE MED
1.0000 m[IU]/min | INTRAVENOUS | Status: DC
  Administered 2017-03-09: 2 m[IU]/min via INTRAVENOUS
  Filled 2017-03-09: qty 1000

## 2017-03-09 MED ORDER — TERBUTALINE SULFATE 1 MG/ML IJ SOLN
0.2500 mg | Freq: Once | INTRAMUSCULAR | Status: DC | PRN
  Filled 2017-03-09: qty 1

## 2017-03-09 MED ORDER — FENTANYL 2.5 MCG/ML BUPIVACAINE 1/10 % EPIDURAL INFUSION (WH - ANES)
14.0000 mL/h | INTRAMUSCULAR | Status: DC | PRN
  Administered 2017-03-09 – 2017-03-10 (×2): 14 mL/h via EPIDURAL
  Filled 2017-03-09 (×2): qty 100

## 2017-03-09 MED ORDER — LIDOCAINE HCL (PF) 1 % IJ SOLN
30.0000 mL | INTRAMUSCULAR | Status: AC | PRN
  Administered 2017-03-10: 30 mL via SUBCUTANEOUS
  Filled 2017-03-09: qty 30

## 2017-03-09 MED ORDER — EPHEDRINE 5 MG/ML INJ
10.0000 mg | INTRAVENOUS | Status: DC | PRN
  Filled 2017-03-09: qty 2

## 2017-03-09 MED ORDER — FENTANYL CITRATE (PF) 100 MCG/2ML IJ SOLN
100.0000 ug | INTRAMUSCULAR | Status: DC | PRN
  Administered 2017-03-09: 100 ug via INTRAVENOUS
  Filled 2017-03-09: qty 2

## 2017-03-09 MED ORDER — PHENYLEPHRINE 40 MCG/ML (10ML) SYRINGE FOR IV PUSH (FOR BLOOD PRESSURE SUPPORT)
80.0000 ug | PREFILLED_SYRINGE | INTRAVENOUS | Status: DC | PRN
  Filled 2017-03-09: qty 10
  Filled 2017-03-09: qty 5

## 2017-03-09 MED ORDER — LIDOCAINE HCL (PF) 1 % IJ SOLN
INTRAMUSCULAR | Status: DC | PRN
  Administered 2017-03-09 (×2): 5 mL

## 2017-03-09 MED ORDER — OXYTOCIN 40 UNITS IN LACTATED RINGERS INFUSION - SIMPLE MED: 2.5000 [IU]/h | INTRAVENOUS | Status: DC

## 2017-03-09 MED ORDER — LACTATED RINGERS IV SOLN
INTRAVENOUS | Status: DC
  Administered 2017-03-09 – 2017-03-10 (×4): via INTRAVENOUS

## 2017-03-09 MED ORDER — ACETAMINOPHEN 325 MG PO TABS
650.0000 mg | ORAL_TABLET | ORAL | Status: DC | PRN
  Administered 2017-03-09: 650 mg via ORAL
  Filled 2017-03-09: qty 2

## 2017-03-09 MED ORDER — ONDANSETRON HCL 4 MG/2ML IJ SOLN
4.0000 mg | Freq: Four times a day (QID) | INTRAMUSCULAR | Status: DC | PRN
Start: 2017-03-09 — End: 2017-03-10

## 2017-03-09 NOTE — Anesthesia Preprocedure Evaluation (Signed)
Anesthesia Evaluation  Patient identified by MRN, date of birth, ID band Patient awake    Reviewed: Allergy & Precautions, H&P , NPO status , Patient's Chart, lab work & pertinent test results  History of Anesthesia Complications Negative for: history of anesthetic complications  Airway Mallampati: II  TM Distance: >3 FB Neck ROM: full    Dental no notable dental hx. (+) Teeth Intact   Pulmonary former smoker,    Pulmonary exam normal breath sounds clear to auscultation       Cardiovascular negative cardio ROS Normal cardiovascular exam Rhythm:regular Rate:Normal     Neuro/Psych Anxiety Depression Bipolar Disorder negative neurological ROS     GI/Hepatic negative GI ROS, Neg liver ROS,   Endo/Other  Morbid obesity  Renal/GU negative Renal ROS  negative genitourinary   Musculoskeletal   Abdominal   Peds  Hematology negative hematology ROS (+)   Anesthesia Other Findings   Reproductive/Obstetrics (+) Pregnancy                             Anesthesia Physical Anesthesia Plan  ASA: III  Anesthesia Plan: Epidural   Post-op Pain Management:    Induction:   PONV Risk Score and Plan:   Airway Management Planned:   Additional Equipment:   Intra-op Plan:   Post-operative Plan:   Informed Consent: I have reviewed the patients History and Physical, chart, labs and discussed the procedure including the risks, benefits and alternatives for the proposed anesthesia with the patient or authorized representative who has indicated his/her understanding and acceptance.     Plan Discussed with:   Anesthesia Plan Comments:         Anesthesia Quick Evaluation

## 2017-03-09 NOTE — H&P (Signed)
LABOR AND DELIVERY ADMISSION HISTORY AND PHYSICAL NOTE  Denise Mclaughlin is a 21 y.o. female G1P0 with IUP at 3662w0d by LMP presenting for IOL for postdates. She reports +FM, + contractions, No LOF, no VB, no blurry vision, headaches or peripheral edema, and RUQ pain.  She plans on breast feeding. She request mirena for birth control.  Prenatal History/Complications:  Past Medical History: Past Medical History:  Diagnosis Date  . ADHD (attention deficit hyperactivity disorder)   . Anxiety   . 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 Bipolar and related disorder (HCC) 05/21/2015  . Bipolar disorder (HCC)   . Cannabis abuse, continuous use 05/21/2015  . Conduct disorder 05/21/2015  . Depression   . Obesity 05/21/2015  . Suicidal ideation 05/21/2015  . Urinary tract infection     Past Surgical History: Past Surgical History:  Procedure Laterality Date  . ABDOMINAL SURGERY    . MOUTH SURGERY      Obstetrical History: OB History    Gravida Para Term Preterm AB Living   1         0   SAB TAB Ectopic Multiple Live Births                  Social History: Social History   Social History  . Marital status: Single    Spouse name: N/A  . Number of children: N/A  . Years of education: N/A   Social History Main Topics  . Smoking status: Former Smoker    Types: Cigarettes    Quit date: 09/12/2016  . Smokeless tobacco: Never Used  . Alcohol use No  . Drug use: Yes    Types: Marijuana, Benzodiazepines     Comment: quit with +UPT  . Sexual activity: Yes    Birth control/ protection: None   Other Topics Concern  . None   Social History Narrative   Lives with Dad, & sister. 1 dog at home. Both parents smoke; pt states she used to smoke cigarettes.    Family History: Family History  Problem Relation Age of Onset  . Hypertension Father   . Depression Mother     Allergies: No Known Allergies  Prescriptions Prior to Admission  Medication Sig Dispense Refill Last Dose  .  calcium carbonate (TUMS - DOSED IN MG ELEMENTAL CALCIUM) 500 MG chewable tablet Chew 1 tablet by mouth daily as needed for indigestion or heartburn.   Past Month at Unknown time  . Prenatal Multivit-Min-Fe-FA (PRENATAL VITAMINS PO) Take 1 tablet by mouth daily.    Past Week at Unknown time     Review of Systems   All systems reviewed and negative except as stated in HPI  Physical Exam Blood pressure 132/62, pulse (!) 101, resp. rate 18, height 5\' 6"  (1.676 m), weight (!) 137 kg (302 lb), last menstrual period 05/26/2016. General appearance: alert, cooperative and appears stated age  Presentation: cephalic Fetal monitoring: 145 bmp, mod var, accels, variable decels Uterine activity: irregular/irritability Dilation: 3.5 Station: -3 Exam by:: Dr. Mosetta PuttFeng   Prenatal labs: ABO, Rh: A/Positive/-- (01/09 0000) Antibody: Negative (01/09 0000) Rubella: Immune RPR: Nonreactive (03/27 0000)  HBsAg: Negative, Negative (01/09 0000)  HIV: Non-reactive (03/27 0000)  GBS: Negative (06/12 0000)  1 hr Glucola: normal Genetic screening:  Quad neg Anatomy US: normal female, velamentous cord insertion with concern for funic presentation, resolved on follow up ultrasound  Prenatal Transfer Tool  Maternal Diabetes: No Genetic Screening: Normal Maternal Ultrasounds/Referrals: Normal Fetal Ultrasounds or other Referrals:  None Maternal Substance Abuse:  History of THC and benzo abuse in 2016, no positive UDS this pregnancy Significant Maternal Medications:  None Significant Maternal Lab Results: Lab values include: Group B Strep negative  No results found for this or any previous visit (from the past 24 hour(s)).  Patient Active Problem List   Diagnosis Date Noted  . Supervision of normal first pregnancy, antepartum 02/19/2017  . 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 Suicidal ideation 05/21/2015  . Bipolar and related disorder (HCC) 05/21/2015  . Obesity 05/21/2015  . Cannabis abuse,  continuous use 05/21/2015  . Suicidal behavior   . Major depressive disorder, single episode, severe without psychotic features (HCC)   . Acetaminophen overdose 05/18/2015    Assessment: Denise Mclaughlin is a 21 y.o. G1P0 at [redacted]w[redacted]d here for IOL for postdates  #Labor: pitocin once fetal heart monitoring appears more reassuring #Pain: Planning epidural #FWB: Category II for mild variables - CNM notified, nurse repositioning patient #ID:  GBS negative #MOF: breast #MOC: mirena #Circ:  outpt ([facility])  Howard Pouch, MD PGY-1 [facility] 03/09/2017, 11:37 AM  I confirm that I have verified the information documented in the resident's note and that I have also personally reperformed the physical exam and all medical decision making activities.  Raelyn Mora, CNM 03/09/2017 3:54 PM

## 2017-03-09 NOTE — Anesthesia Pain Management Evaluation Note (Signed)
  [facility] Pain Management Visit Note  Patient: Denise Mclaughlin, 21 y.o., female  "Hello I am a member of the anesthesia team at [facility]. We have an anesthesia team available at all times to provide care throughout the hospital, including epidural management and anesthesia for C-section. I don't know your plan for the delivery whether it a natural birth, water birth, IV sedation, nitrous supplementation, doula or epidural, but we want to meet your pain goals."   1.Was your pain managed to your expectations on prior hospitalizations?   No prior hospitalizations  2.What is your expectation for pain management during this hospitalization?     Epidural  3.How can we help you reach that goal? epidural  Record the patient's initial score and the patient's pain goal.   Pain: 6  Pain Goal: 8 [facility] wants you to be able to say your pain was always managed very well.  Denise Mclaughlin 03/09/2017

## 2017-03-09 NOTE — Progress Notes (Signed)
Subjective: Patient seen and examined for progress of labor. Patient comfortable.   Objective: Vitals:   03/09/17 2006 03/09/17 2031 03/09/17 2101 03/09/17 2105  BP: (!) 158/75 (!) 149/87 (!) 150/74   Pulse: 91 97 97   Resp: 18 (P) 18 (P) 18 (P) 18  Temp: 98.1 F (36.7 C)     TempSrc: Oral     SpO2:      Weight:      Height:       FHT:  FHR: 150 bpm, variability: moderate,  accelerations:  Present,  decelerations:  Absent UC:   irregular, every 4-8 minutes SVE:  Dilation: 6  Effacement: 80%  Station: -2  Exam by: C. Marielys Trinidad SNM Labs: Lab Results  Component Value Date   WBC 10.8 (H) 03/09/2017   HGB 10.6 (L) 03/09/2017   HCT 31.9 (L) 03/09/2017   MCV 79.4 03/09/2017   PLT 168 03/09/2017   Assessment / Plan: IUP at 41 weeks. IOL PD. GBS neg.  Discussed with patient risks and benefits of AROM and placement of internal monitors to assist with tracing FHR and contraction pattern. Patient agreeable with plan.   AROM with small amount of light meconium stained fluid. IUPC and FSE placed without difficulty. Patient tolerated procedure well.  Plan: monitor FHR and contraction pattern. Restart pitocin 2x2 in 30 minutes if FHR reassuring.  Cleone SlimCaroline Marsa Matteo SNM 03/09/2017, 9:17 PM

## 2017-03-09 NOTE — Anesthesia Procedure Notes (Signed)
Epidural Patient location during procedure: OB  Staffing Anesthesiologist: Phillips GroutARIGNAN, Denise Mclaughlin Performed: anesthesiologist   Preanesthetic Checklist Completed: patient identified, site marked, surgical consent, pre-op evaluation, timeout performed, IV checked, risks and benefits discussed and monitors and equipment checked  Epidural Patient position: sitting Prep: DuraPrep Patient monitoring: heart rate, continuous pulse ox and blood pressure Approach: midline Location: L4-L5 Injection technique: LOR saline  Needle:  Needle type: Tuohy  Needle gauge: 17 G Needle length: 9 cm and 9 Needle insertion depth: 10 cm Catheter type: closed end flexible Catheter size: 20 Guage Catheter at skin depth: 15 cm Test dose: negative  Assessment Events: blood not aspirated, injection not painful, no injection resistance, negative IV test and no paresthesia  Additional Notes Patient identified. Risks/Benefits/Options discussed with patient including but not limited to bleeding, infection, nerve damage, paralysis, failed block, incomplete pain control, headache, blood pressure changes, nausea, vomiting, reactions to medication both or allergic, itching and postpartum back pain. Confirmed with bedside nurse the patient's most recent platelet count. Confirmed with patient that they are not currently taking any anticoagulation, have any bleeding history or any family history of bleeding disorders. Patient expressed understanding and wished to proceed. All questions were answered. Sterile technique was used throughout the entire procedure. Please see nursing notes for vital signs. Test dose was given through epidural needle and negative prior to continuing to dose epidural or start infusion. Warning signs of high block given to the patient including shortness of breath, tingling/numbness in hands, complete motor block, or any concerning symptoms with instructions to call for help. Patient was given instructions  on fall risk and not to get out of bed. All questions and concerns addressed with instructions to call with any issues.

## 2017-03-10 ENCOUNTER — Encounter (HOSPITAL_COMMUNITY): Payer: Self-pay | Admitting: Family Medicine

## 2017-03-10 DIAGNOSIS — Z3A41 41 weeks gestation of pregnancy: Secondary | ICD-10-CM

## 2017-03-10 DIAGNOSIS — O480 Post-term pregnancy: Secondary | ICD-10-CM

## 2017-03-10 LAB — RPR: RPR Ser Ql: NONREACTIVE

## 2017-03-10 MED ORDER — COCONUT OIL OIL: 1.0000 "application " | TOPICAL_OIL | Status: DC | PRN

## 2017-03-10 MED ORDER — SENNOSIDES-DOCUSATE SODIUM 8.6-50 MG PO TABS
2.0000 | ORAL_TABLET | ORAL | Status: DC
  Administered 2017-03-10 – 2017-03-11 (×2): 2 via ORAL
  Filled 2017-03-10 (×2): qty 2

## 2017-03-10 MED ORDER — DIPHENHYDRAMINE HCL 25 MG PO CAPS: 25.0000 mg | ORAL_CAPSULE | Freq: Four times a day (QID) | ORAL | Status: DC | PRN

## 2017-03-10 MED ORDER — BENZOCAINE-MENTHOL 20-0.5 % EX AERO
1.0000 "application " | INHALATION_SPRAY | CUTANEOUS | Status: DC | PRN
  Administered 2017-03-10 – 2017-03-12 (×2): 1 via TOPICAL
  Filled 2017-03-10 (×2): qty 56

## 2017-03-10 MED ORDER — IBUPROFEN 600 MG PO TABS
600.0000 mg | ORAL_TABLET | Freq: Four times a day (QID) | ORAL | Status: DC
  Administered 2017-03-10 – 2017-03-12 (×9): 600 mg via ORAL
  Filled 2017-03-10 (×9): qty 1

## 2017-03-10 MED ORDER — DIBUCAINE 1 % RE OINT: 1.0000 "application " | TOPICAL_OINTMENT | RECTAL | Status: DC | PRN

## 2017-03-10 MED ORDER — METHYLERGONOVINE MALEATE 0.2 MG/ML IJ SOLN: 0.2000 mg | INTRAMUSCULAR | Status: DC | PRN

## 2017-03-10 MED ORDER — ACETAMINOPHEN 325 MG PO TABS
650.0000 mg | ORAL_TABLET | ORAL | Status: DC | PRN
Start: 2017-03-10 — End: 2017-03-12
  Administered 2017-03-10 – 2017-03-11 (×3): 650 mg via ORAL
  Filled 2017-03-10 (×3): qty 2

## 2017-03-10 MED ORDER — ONDANSETRON HCL 4 MG/2ML IJ SOLN: 4.0000 mg | INTRAMUSCULAR | Status: DC | PRN

## 2017-03-10 MED ORDER — AMLODIPINE BESYLATE 5 MG PO TABS
5.0000 mg | ORAL_TABLET | Freq: Every day | ORAL | Status: DC
  Administered 2017-03-10: 5 mg via ORAL
  Filled 2017-03-10 (×2): qty 1

## 2017-03-10 MED ORDER — OXYCODONE HCL 5 MG PO TABS: 10.0000 mg | ORAL_TABLET | ORAL | Status: DC | PRN

## 2017-03-10 MED ORDER — MEASLES, MUMPS & RUBELLA VAC ~~LOC~~ INJ: 0.5000 mL | INJECTION | Freq: Once | SUBCUTANEOUS | Status: DC

## 2017-03-10 MED ORDER — METHYLERGONOVINE MALEATE 0.2 MG PO TABS: 0.2000 mg | ORAL_TABLET | ORAL | Status: DC | PRN

## 2017-03-10 MED ORDER — ZOLPIDEM TARTRATE 5 MG PO TABS: 5.0000 mg | ORAL_TABLET | Freq: Every evening | ORAL | Status: DC | PRN

## 2017-03-10 MED ORDER — SIMETHICONE 80 MG PO CHEW: 80.0000 mg | CHEWABLE_TABLET | ORAL | Status: DC | PRN

## 2017-03-10 MED ORDER — TETANUS-DIPHTH-ACELL PERTUSSIS 5-2.5-18.5 LF-MCG/0.5 IM SUSP: 0.5000 mL | Freq: Once | INTRAMUSCULAR | Status: DC

## 2017-03-10 MED ORDER — ONDANSETRON HCL 4 MG PO TABS: 4.0000 mg | ORAL_TABLET | ORAL | Status: DC | PRN

## 2017-03-10 MED ORDER — WITCH HAZEL-GLYCERIN EX PADS: 1.0000 "application " | MEDICATED_PAD | CUTANEOUS | Status: DC | PRN

## 2017-03-10 MED ORDER — OXYCODONE HCL 5 MG PO TABS
5.0000 mg | ORAL_TABLET | ORAL | Status: DC | PRN
  Administered 2017-03-10 – 2017-03-11 (×3): 5 mg via ORAL
  Filled 2017-03-10 (×3): qty 1

## 2017-03-10 MED ORDER — PRENATAL MULTIVITAMIN CH
1.0000 | ORAL_TABLET | Freq: Every day | ORAL | Status: DC
  Administered 2017-03-11 – 2017-03-12 (×2): 1 via ORAL
  Filled 2017-03-10 (×3): qty 1

## 2017-03-10 NOTE — Lactation Note (Signed)
This note was copied from a baby's chart. Lactation Consultation Note  Patient Name: Denise Melton AlarBrittany Palau Today's Date: 03/10/2017 Reason for consult: Initial assessment Called by Nursery RN to see Mom. Baby rooting and not able to latch due to flat nipples. RN hand expressed few drops of colostrum into baby's mouth [name]. Baby now asleep STS on Mom. Mom does have flat nipples that are compressible but become more flat with breast compression. Advised Mom to use hand pump/hand expression prior to latch to see if this will make latch easier. Advised to wear breast shells. Basic teaching reviewed with Mom, encouraged to offer breast with feeding ques. Lactation brochure left for review, advised of OP services and support group. LC left phone number for Mom to call with next feeding for assist.   Maternal Data Has patient been taught Hand Expression?: Yes Does the patient have breastfeeding experience prior to this delivery?: No  Feeding Feeding Type: Breast Fed Length of feed:  (drops)  LATCH Score/Interventions                      Lactation Tools Discussed/Used Tools: Pump;Shells Shell Type: Inverted Breast pump type: Manual WIC Program: Yes   Consult Status Consult Status: Follow-up Date: 03/10/17 Follow-up type: In-patient    [name], Brookie Wayment Ann 03/10/2017, 8:55 AM

## 2017-03-10 NOTE — Progress Notes (Signed)
Patients blood pressure elevated 143/95.  Dr. Omer JackMumaw notified.  Dr. Charline BillsStated she was aware of elevated BP since pts admission.  Will continue to monitor for PIH symptoms.  Pt with no symptoms at this time.

## 2017-03-10 NOTE — Lactation Note (Signed)
This note was copied from a baby's chart. Lactation Consultation Note  Patient Name: Denise Melton AlarBrittany Goda ZOXWR'UToday's Date: 03/10/2017 Reason for consult: Follow-up assessment;Difficult latch Mom called for assist with latch. Pre-pumped right breast, Mom received some colostrum. LC attempted to latch baby using breast compression, nipple very flat and goes inward with breast compression making latch even more difficult. Baby took few suckles but not on nipple well. Initiated 24 nipple shield and baby was able to latch using the nipple shield. Scant amount of colostrum visible in nipple shield with feedings. Reviewed nipple shield use with Mom and advised Mom that RN would be setting her up with DEBP to start pumping every 3 hours to prepare nipples for baby to latch and to encourage milk production. Mom will need further assist with using nipple shield due to her nipple type. Advised RN of initiation of nipple shield and Mom's need for further assist with latching baby. RN will set up DEBP for Mom. Encouraged to call for assist as needed.   Maternal Data Has patient been taught Hand Expression?: Yes Does the patient have breastfeeding experience prior to this delivery?: No  Feeding Feeding Type: Breast Fed Length of feed:  (drops)  LATCH Score/Interventions Latch: Repeated attempts needed to sustain latch, nipple held in mouth throughout feeding, stimulation needed to elicit sucking reflex. (initiated 24 nipple shield) Intervention(s): Adjust position;Assist with latch;Breast massage;Breast compression  Audible Swallowing: A few with stimulation  Type of Nipple: Flat Intervention(s): Shells;Hand pump  Comfort (Breast/Nipple): Soft / non-tender     Hold (Positioning): Assistance needed to correctly position infant at breast and maintain latch.  LATCH Score: 6  Lactation Tools Discussed/Used Tools: Shells;Nipple [name];Pump Nipple shield size: 24 Shell Type: Inverted Breast pump type:  Manual WIC Program: Yes   Consult Status Consult Status: Follow-up Date: 03/11/17 Follow-up type: In-patient    [name], [name] 03/10/2017, 10:24 AM

## 2017-03-10 NOTE — Anesthesia Postprocedure Evaluation (Signed)
Anesthesia Post Note  Patient: [place] Southern Territories[place]ittany Anguiano  Procedure(s) Performed: * No procedures listed *     Patient location during evaluation: Mother Baby Anesthesia Type: Epidural Level of consciousness: awake and alert Pain management: pain level controlled Vital Signs Assessment: post-procedure vital signs reviewed and stable Respiratory status: spontaneous breathing, nonlabored ventilation and respiratory function stable Cardiovascular status: stable Postop Assessment: no headache, no backache and epidural receding Anesthetic complications: no    Last Vitals:  Vitals:   03/10/17 0920 03/10/17 1300  BP: (!) 148/71 (!) 143/95  Pulse: (!) 109 (!) 108  Resp: 18 18  Temp: 37.3 C 36.9 C    Last Pain:  Vitals:   03/10/17 1437  TempSrc:   PainSc: 7    Pain Goal: Patients Stated Pain Goal: 0 (03/09/17 2101)               Junious SilkGILBERT,Eliott Amparan

## 2017-03-11 NOTE — Clinical Social Work Maternal (Signed)
  CLINICAL SOCIAL WORK MATERNAL/CHILD NOTE  Patient Details  Name: Denise Mclaughlin MRN: [id number] Date of Birth: 12-08-1995  Date:  03/11/2017  Clinical Social Worker Initiating Note:  Ferdinand Lango Shameer Molstad, MSW, LCSW-A  Date/ Time Initiated:  03/11/17/1157     Child's Name:  Denise Mclaughlin.    Legal Guardian:  Other (Comment) (Not established by court system; MOB and FOB Debbe Bales Sr. DOB 06/23/1999 parent child collectively )   Need for Interpreter:  None   Date of Referral:  03/10/17     Reason for Referral:  Current Substance Use/Substance Use During Pregnancy , Other (Comment) (MOB hx of Bipolar, anxiety, SI )   Referral Source:  RN   Address:  [place], [place] [address]  Phone number:  [phone]   Household Members:  Self, Parents   Natural Supports (not living in the home):  Spouse/significant other, Friends, Extended Family   Professional Supports: None   Employment: Unemployed   Type of Work: [employer]  Education:  Database administrator Resources:  [employer]   Other Resources:  [facility]   Cultural/Religious Considerations Which May Impact Care:  per Counsellor   Strengths:  Ability to meet basic needs , Compliance with medical plan , Home prepared for child , Pediatrician chosen  ([place] )   Risk Factors/Current Problems:  Substance Use , Mental Health Concerns    Cognitive State:  Able to Concentrate , Alert , Insightful , Goal Oriented    Mood/Affect:  Calm , Comfortable , Happy , Interested    CSW Assessment: CSW met with MOB at bedside to complete assessment for consult regarding hx of Bipoar, anxiety, SI and THC use. Upon this writers arrival, MOB was laying in bed while baby was asleep in basinet. This Probation officer explained role and reasoning for visit. MOB verbalized understanding and was warm and welcoming. This Probation officer inquired about MOB's behavioral health hx. MOB confirms she has been dx with bipolar,  depression and anxiety; however, it was when she was younger and in high school. MOB notes she had a rough time back then but has since improved her coping skill process and does not require medication. This Probation officer inquired if MOB is interested in receiving behavioral health resources for follow-up. MOB notes she is not as she feels she is doing well and has a great support system. This Probation officer observed MOB to have several supports at the bedside earlier before completing assessment and all seem very supportive of MOB and baby. MOB denies SI/HI/AH with this Probation officer at this time.   CSW inquired about MOB's hx of THC use. MOB notes she used recreationally only prior to pregnancy being confirmed. MOB further notes that her last use was 8+ months ago. CSW informed MOB of hospitals policy and procedure regarding substance use and mandated reporting for positive results. MOB verbalized understanding. This Probation officer informed MOB that baby's results have not come back; however, if positive she will be notified. At this time, no other needs addressed or requested. CSW has no barriers to d/c at this time.   CSW Plan/Description:  Information/Referral to [employer] , [employer] , No Further Intervention Required/No Barriers to Discharge, Other (Comment) (CSW will continue to follow pending UDS and CDS results and make a report for positive screens. )   Ferdinand Lango Jilberto Vanderwall, MSW, [facility]  Office: [phone]

## 2017-03-11 NOTE — Progress Notes (Signed)
Pt called out requesting got formula for infant. In to see pt and address concerns. Pt stated infant is fussy and hungry, feed infant with 5cc for breast milk. Pt also stated she tried pumping and did not get any milk this time. Pt educated and encouraged to pump every 3hrs, attempt to latch infant to call for help to latch, and hand express. Encouraged pt to pump again for 15-20. Pt pumped and did not get any milk and insist on formula. Formula given.

## 2017-03-11 NOTE — Progress Notes (Signed)
POSTPARTUM PROGRESS NOTE  Post Partum Day 1 Subjective:  Denise Mclaughlin is a 21 y.o. G1P1001 728w1d s/p nsvd.  No acute events overnight.  Pt denies problems with ambulating, voiding or po intake.  She denies nausea or vomiting.  Pain is well controlled.  She has had flatus. She has not had bowel movement.  Lochia Small.   Objective: Blood pressure 107/62, pulse (!) 102, temperature 98.2 F (36.8 C), temperature source Oral, resp. rate 20, height 5\' 6"  (1.676 m), weight (!) 302 lb (137 kg), last menstrual period 05/26/2016, SpO2 99 %, unknown if currently breastfeeding.  Physical Exam:  General: alert, cooperative and no distress Lochia:normal flow Chest: CTAB Heart: RRR no m/r/g Abdomen: +BS, soft, nontender,  Uterine Fundus: firm,  DVT Evaluation: No calf swelling or tenderness Extremities: trace edema   Recent Labs  03/09/17 0950 03/09/17 1725  HGB 11.1* 10.6*  HCT 34.8* 31.9*    Assessment/Plan:  ASSESSMENT: Denise Mclaughlin is a 21 y.o. G1P1001 7628w1d s/p nsvd, doing well. No ha, vision change, or upper abdominal pain. bp low normal this morning, will d/c and monitor bp next 24 hours, hopefully d/c tomorrow w/o meds  Plan for discharge tomorrow   LOS: 2 days   Silvano Bilisoah B Chyler Creely 03/11/2017, 6:47 AM

## 2017-03-11 NOTE — Lactation Note (Signed)
This note was copied from a baby's chart. Lactation Consultation Note  Mom's feeding intention at admission says "breastmilk w/ [mainly bottle feeding]." In light of this and infrequent pumping, I asked RN to switch her to regular formula to ensure lactose intake.   Lurline HareRichey, Letesha Klecker [facility] 03/11/2017, 10:51 PM

## 2017-03-12 NOTE — Lactation Note (Signed)
This note was copied from a baby's chart. Lactation Consultation Note  Patient Name: Boy Melton AlarBrittany Feuerborn KGMWN'UToday's Date: 03/12/2017 Reason for consult: Follow-up assessment;Difficult latch   Follow up with mom of 51 hour old infant. Infant with 8 formula feeds via bottle of 10-30 ml, 2 voids and 4 stool in last 24 hours.   Mom reports she has difficulty latching infant to breast die to inverted nipples. She is using a NS. She is planning to use formula until her milk is in. She reports her breasts do not feel fuller today. [facility], mom declined and wants to use a manual pump [facility]. She is not pumping regularly. Advised mom to take pump tubing home. Enc mom to pump 8-12 x in 24 hours to promote and protect milk supply. Engorgement prevention/treatment and breast milk handling and storage reviewed.   Maternal Data Does the patient have breastfeeding experience prior to this delivery?: No  Feeding Feeding Type: Bottle Fed - Formula Nipple Type: Slow - flow  LATCH Score/Interventions                      Lactation Tools Discussed/Used WIC Program: Yes Pump Review: Setup, frequency, and cleaning;Milk Storage Initiated by:: Reviewed and encouraged   Consult Status Consult Status: Complete Follow-up type: Call as needed    Ed BlalockSharon S Keimani Laufer 03/12/2017, 9:17 AM

## 2017-03-12 NOTE — Discharge Summary (Signed)
OB Discharge Summary     Patient Name: Denise Mclaughlin DOB: 12/19/1995 MRN: [id number]  Date of admission: 03/09/2017 Delivering MD: Denise Mclaughlin   Date of discharge: 03/12/2017  Admitting diagnosis: 41wks, induction Intrauterine pregnancy: 3976w1d     Secondary diagnosis:  Active Problems:   Pregnancy  Additional problems:  Patient Active Problem List   Diagnosis Date Noted  . Pregnancy 03/09/2017  . Supervision of normal first pregnancy, antepartum 02/19/2017  . 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 Suicidal ideation 05/21/2015  . Bipolar and related disorder (HCC) 05/21/2015  . Obesity 05/21/2015  . Cannabis abuse, continuous use 05/21/2015  . Suicidal behavior   . Major depressive disorder, single episode, severe without psychotic features (HCC)   . Acetaminophen overdose 05/18/2015        Discharge diagnosis: Term Pregnancy Delivered                                                                                                Post partum procedures:none  Augmentation: Pitocin  Complications: None  Hospital course:  Induction of Labor With Vaginal Delivery   21 y.o. yo G1P1001 at 4676w1d was admitted to the hospital 03/09/2017 for induction of labor.  Indication for induction: Postdates.  Patient had an uncomplicated labor course as follows: Membrane Rupture Time/Date: 9:05 PM ,03/09/2017   Intrapartum Procedures: Episiotomy:                                           Lacerations:  2nd degree [3];Sulcus [9]  Patient had delivery of a Viable infant.  Information for the patient's newborn:  Denise Mclaughlin, Boy [name] [[id number]][[id number]]  Delivery Method: Vaginal, Spontaneous Delivery (Filed from Delivery Summary)   03/10/2017  Details of delivery can be found in separate delivery note.  Patient had a routine postpartum course. Patient is discharged home 03/12/17.  Physical exam  Vitals:   03/11/17 0559 03/11/17 1223 03/11/17 1749 03/12/17 0518  BP: 107/62 (!) 141/83  130/80 (!) 121/51  Pulse: (!) 102 (!) 123 (!) 113 (!) 109  Resp: 20  19 20   Temp: 98.2 F (36.8 C)  98.4 F (36.9 C) 98.7 F (37.1 C)  TempSrc: Oral  Oral Oral  SpO2:   98%   Weight:      Height:       General: alert, cooperative and no distress Lochia: appropriate Uterine Fundus: firm Incision: N/A DVT Evaluation: No evidence of DVT seen on physical exam. Labs: Lab Results  Component Value Date   WBC 10.8 (H) 03/09/2017   HGB 10.6 (L) 03/09/2017   HCT 31.9 (L) 03/09/2017   MCV 79.4 03/09/2017   PLT 168 03/09/2017   CMP Latest Ref Rng & Units 03/09/2017  Glucose 65 - 99 mg/dL 865(H103(H)  BUN 6 - 20 mg/dL 10  Creatinine 8.460.44 - 9.621.00 mg/dL 9.520.51  Sodium 841135 - 324145 mmol/L 136  Potassium 3.5 - 5.1 mmol/L 4.1  Chloride 101 - 111 mmol/L 109  CO2 22 - 32  mmol/L 21(L)  Calcium 8.9 - 10.3 mg/dL 4.0(J)  Total Protein 6.5 - 8.1 g/dL 8.1(X)  Total Bilirubin 0.3 - 1.2 mg/dL 9.1(Y)  Alkaline Phos 38 - 126 U/L 281(H)  AST 15 - 41 U/L 17  ALT 14 - 54 U/L 12(L)    Discharge instruction: per After Visit Summary and "Baby and Me Booklet".  After visit meds:  Allergies as of 03/12/2017   No Known Allergies     Medication List    You have not been prescribed any medications.     Diet: routine diet  Activity: Advance as tolerated. Pelvic rest for 6 weeks.   Outpatient follow up:6 weeks Follow up Appt: Future Appointments Date Time Provider Department Center  04/16/2017 1:20 PM Marylene Land, CNM [facility]   Follow up Visit:No Follow-up on file.  Postpartum contraception: Nexplanon  Newborn Data: Live born female  Birth Weight: 6 lb 13 oz (3090 g) APGAR: 8, 9  Baby Feeding: Breast Disposition:home with mother   03/12/2017 Marylene Land, CNM

## 2017-04-16 ENCOUNTER — Ambulatory Visit (INDEPENDENT_AMBULATORY_CARE_PROVIDER_SITE_OTHER): Payer: Medicaid Other | Admitting: Student

## 2017-04-16 ENCOUNTER — Encounter: Payer: Self-pay | Admitting: Student

## 2017-04-16 DIAGNOSIS — Z1389 Encounter for screening for other disorder: Secondary | ICD-10-CM | POA: Diagnosis not present

## 2017-04-16 LAB — POCT PREGNANCY, URINE: Preg Test, Ur: NEGATIVE

## 2017-04-16 MED ORDER — NORELGESTROMIN-ETH ESTRADIOL 150-35 MCG/24HR TD PTWK: 1.0000 | MEDICATED_PATCH | TRANSDERMAL | 12 refills | Status: DC

## 2017-04-16 NOTE — Progress Notes (Signed)
Subjective:     Denise Mclaughlin is a 21 y.o. female who presents for a postpartum visit. She is 5 weeks postpartum following a spontaneous vaginal delivery. I have fully reviewed the prenatal and intrapartum course. The delivery was at  gestational weeks. Outcome: spontaneous vaginal delivery. Anesthesia: epidural. Postpartum course has been uneventful Baby's course has been uneventful. Baby is feeding by bottle - Similac Advance. Bleeding no bleeding. Bowel function is normal. Bladder function is normal. Patient is sexually active. Contraception method is Ship brokerrtho-Evra patches weekly. Postpartum depression screening: negative.    Review of Systems Pertinent items are noted in HPI.   Objective:    BP 112/87   Pulse 94   Wt 269 lb 12.8 oz (122.4 kg)   BMI 43.55 kg/m   General:  alert, cooperative and no distress   Breasts:  inspection negative, no nipple discharge or bleeding, no masses or nodularity palpable  Lungs: clear to auscultation bilaterally  Heart:  regular rate and rhythm, S1, S2 normal, no murmur, click, rub or gallop  Abdomen: soft, non-tender; bowel sounds normal; no masses,  no organomegaly   Vulva:  2nd degree laceration well-healed. Sulcus repair well-healed.   Vagina: normal vagina  Cervix:  not examined.   Corpus: not examined  Adnexa:  normal adnexa  Rectal Exam: Not performed.        Assessment:    Healthy postpartum exam. Pap smear not done at today's visit.   Plan:    1. Contraception: Ortho-Evra patches weekly 2. Patient's UPT is negative, but patient has had unprotected sex 1 week ago. Patient strongly desires her birth control today. Patient will take another pregnancy test in one week and then fill her RX for patch if it is negative. If pregnancy test is positive, patient will call clinic for appt.  3. Follow up in: 1 year or as needed.    Denise Mclaughlin

## 2017-04-17 NOTE — Patient Instructions (Signed)

## 2017-09-11 NOTE — L&D Delivery Note (Signed)
Progressed w/o augmentation, 10 minute 2nd stage  Delivery Note At 11:48 PM a viable female was delivered via Vaginal, Spontaneous (Presentation: LOA  ).  APGAR: 8/9, ; weight  Pending. After 1 minute, the cord was clamped and cut. 40 units of pitocin diluted in 1000cc LR was infused rapidly IV.  The placenta separated spontaneously and delivered via CCT and maternal pushing effort.  It was inspected and appears to be intact with a 3 VC.   Anesthesia:  epidural Episiotomy:   Lacerations:  1st degree perineal Suture Repair: 2.0 vicryl Est. Blood Loss (mL): 382  Mom to postpartum.  Baby to Couplet care / Skin to Skin.   Assisted Para Denise Mclaughlin (med student) in delivery and 3rd stage management Denise Mclaughlin 04/05/2018, 12:15 AM      Please schedule this patient for PP visit in: 1 week High [facility] Delivery mode:  SVD Anticipated Birth Control:  Nexplanon PP Procedures needed: BP check  Schedule Integrated BH visit: yes Provider: Any provider .

## 2017-09-21 ENCOUNTER — Ambulatory Visit (INDEPENDENT_AMBULATORY_CARE_PROVIDER_SITE_OTHER): Payer: Medicaid Other | Admitting: General Practice

## 2017-09-21 DIAGNOSIS — Z3201 Encounter for pregnancy test, result positive: Secondary | ICD-10-CM | POA: Diagnosis not present

## 2017-09-21 LAB — POCT PREGNANCY, URINE: Preg Test, Ur: POSITIVE — AB

## 2017-09-21 NOTE — Progress Notes (Signed)
Patient here for upt today. UPT +. Patient reports first positive home test December 20. LMP 07/27/17 EDD 05/03/18 8 weeks. Patient denies taking any meds or vitamins. Recommended she begin PNV and OB care. Patient verbalized understanding & had no questions

## 2017-10-17 ENCOUNTER — Other Ambulatory Visit (HOSPITAL_COMMUNITY)
Admission: RE | Admit: 2017-10-17 | Discharge: 2017-10-17 | Disposition: A | Discharge status: Home / Self Care | Payer: Medicaid Other | Source: Ambulatory Visit | Attending: Certified Nurse Midwife | Admitting: Certified Nurse Midwife

## 2017-10-17 ENCOUNTER — Encounter: Payer: Self-pay | Admitting: Certified Nurse Midwife

## 2017-10-17 ENCOUNTER — Ambulatory Visit (INDEPENDENT_AMBULATORY_CARE_PROVIDER_SITE_OTHER): Payer: Medicaid Other | Admitting: Certified Nurse Midwife

## 2017-10-17 VITALS — BP 116/79 | HR 112 | Wt 291.0 lb

## 2017-10-17 DIAGNOSIS — Z3480 Encounter for supervision of other normal pregnancy, unspecified trimester: Secondary | ICD-10-CM

## 2017-10-17 DIAGNOSIS — B9689 Other specified bacterial agents as the cause of diseases classified elsewhere: Secondary | ICD-10-CM | POA: Diagnosis not present

## 2017-10-17 DIAGNOSIS — Z3481 Encounter for supervision of other normal pregnancy, first trimester: Secondary | ICD-10-CM

## 2017-10-17 DIAGNOSIS — K219 Gastro-esophageal reflux disease without esophagitis: Secondary | ICD-10-CM

## 2017-10-17 DIAGNOSIS — O99611 Diseases of the digestive system complicating pregnancy, first trimester: Secondary | ICD-10-CM

## 2017-10-17 DIAGNOSIS — Z9889 Other specified postprocedural states: Secondary | ICD-10-CM

## 2017-10-17 MED ORDER — OMEPRAZOLE 20 MG PO CPDR: 20.0000 mg | DELAYED_RELEASE_CAPSULE | Freq: Two times a day (BID) | ORAL | 5 refills | Status: AC

## 2017-10-17 MED ORDER — PRENATE PIXIE 10-0.6-0.4-200 MG PO CAPS: 1.0000 | ORAL_CAPSULE | Freq: Every day | ORAL | 12 refills | Status: AC

## 2017-10-17 NOTE — Progress Notes (Signed)
Pt needs rx for PNV and heartburn.  Pt reports h/o taking Wellbutrin XL 300 1QD, has not taken this pregnancy. Delivered female in 02/2017.

## 2017-10-17 NOTE — Progress Notes (Signed)
Subjective:   Denise Mclaughlin is a 22 y.o. G2P1001 at [redacted]w[redacted]d by LMP being seen today for her first obstetrical visit.  Her obstetrical history is significant for obesity. Patient does not intend to breast feed. Pregnancy history fully reviewed. Hx of abdominal surgery as an infant; ?pyloric stenosis.   Patient reports nausea, no bleeding, no contractions, no cramping and no leaking.  HISTORY: Obstetric History   G2   P1   T1   P0   A0   L1    SAB0   TAB0   Ectopic0   Multiple0   Live Births1     # Outcome Date GA Lbr Len/2nd Weight Sex Delivery Anes PTL Lv  2 Current           1 Term 03/10/17 [redacted]w[redacted]d 06:37 / 02:29 6 lb 13 oz (3.09 kg) M Vag-Spont EPI  LIV     Name: Halabi,BOY Trenika     Apgar1:  8                Apgar5: 9      Last pap smear was done n/a <21 years.  Past Medical History:  Diagnosis Date  . ADHD (attention deficit hyperactivity disorder)   . Anxiety   . 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 Bipolar and related disorder (HCC) 05/21/2015  . Bipolar disorder (HCC)   . Cannabis abuse, continuous use 05/21/2015  . Conduct disorder 05/21/2015  . Depression   . Obesity 05/21/2015  . Suicidal ideation 05/21/2015  . Urinary tract infection    Past Surgical History:  Procedure Laterality Date  . ABDOMINAL SURGERY     ?pyloric stenosis  . MOUTH SURGERY     Family History  Problem Relation Age of Onset  . Hypertension Father   . Depression Mother    Social History   Tobacco Use  . Smoking status: Former Smoker    Types: Cigarettes    Last attempt to quit: 09/12/2016    Years since quitting: 1.0  . Smokeless tobacco: Never Used  Substance Use Topics  . Alcohol use: No  . Drug use: Yes    Types: Marijuana, Benzodiazepines    Comment: quit with +UPT   No Known Allergies No current outpatient medications on file prior to visit.   No current facility-administered medications on file prior to visit.     Review of Systems Pertinent items noted in HPI  and remainder of comprehensive ROS otherwise negative.  Exam   Vitals:   10/17/17 1405  BP: 116/79  Pulse: (!) 112  Weight: 291 lb (132 kg)      Uterus:     Pelvic Exam: Perineum: no hemorrhoids, normal perineum   Vulva: normal external genitalia, no lesions   Vagina:  normal mucosa, normal discharge   Cervix: no lesions and normal, pap smear done.    Adnexa: normal adnexa and no mass, fullness, tenderness   Bony Pelvis: average  System: General: well-developed, well-nourished female in no acute distress   Breast:  normal appearance, no masses or tenderness   Skin: normal coloration and turgor, no rashes   Neurologic: oriented, normal, negative, normal mood   Extremities: normal strength, tone, and muscle mass, ROM of all joints is normal   HEENT PERRLA, extraocular movement intact and sclera clear, anicteric   Mouth/Teeth mucous membranes moist, pharynx normal without lesions and dental hygiene good   Neck supple and no masses   Cardiovascular: regular rate and rhythm   Respiratory:  no respiratory distress, normal breath sounds   Abdomen: soft, non-tender; bowel sounds normal; no masses,  no organomegaly; morbid obesity     Assessment:   Pregnancy: G2P1001 Patient Active Problem List   Diagnosis Date Noted  . Supervision of other normal pregnancy, antepartum 10/17/2017  . 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 Suicidal ideation 05/21/2015  . Bipolar and related disorder (HCC) 05/21/2015  . Obesity 05/21/2015  . Cannabis abuse, continuous use 05/21/2015  . Suicidal behavior   . Major depressive disorder, single episode, severe without psychotic features (HCC)   . Acetaminophen overdose 05/18/2015     Plan:  1. Supervision of other normal pregnancy, antepartum    - Cervicovaginal ancillary only - Culture, OB Urine - Hemoglobin A1c - Hemoglobinopathy evaluation - Inheritest Core(CF97,SMA,FraX) - Obstetric Panel, Including HIV - VITAMIN D 25 Hydroxy  (Vit-D Deficiency, Fractures) - Prenat-FeAsp-Meth-FA-DHA w/o A (PRENATE PIXIE) 10-0.6-0.4-200 MG CAPS; Take 1 tablet by mouth daily.  Dispense: 30 capsule; Refill: 12 - US MFM OB DETAIL +14 WK; Future  2. Gastroesophageal reflux during pregnancy in first trimester, antepartum     - omeprazole (PRILOSEC) 20 MG capsule; Take 1 capsule (20 mg total) by mouth 2 (two) times daily before a meal.  Dispense: 60 capsule; Refill: 5   Initial labs drawn. Continue prenatal vitamins. Genetic Screening discussed, NIPS: requested. Ultrasound discussed; fetal anatomic survey: ordered. Problem list reviewed and updated. The nature [facility] with multiple MDs and other Advanced Practice Providers was explained to patient; also emphasized that residents, students are part of our team. Routine obstetric precautions reviewed. Return in about 4 weeks (around 11/14/2017) for ROB, MSAFP/Panorama.     Orvilla Cornwallachelle Torina Ey, CNM [facility]

## 2017-10-18 LAB — CERVICOVAGINAL ANCILLARY ONLY
Bacterial vaginitis: POSITIVE — AB
CHLAMYDIA, DNA PROBE: NEGATIVE
Candida vaginitis: NEGATIVE
Neisseria Gonorrhea: NEGATIVE
Trichomonas: NEGATIVE

## 2017-10-18 LAB — VITAMIN D 25 HYDROXY (VIT D DEFICIENCY, FRACTURES): Vit D, 25-Hydroxy: 8.6 ng/mL — ABNORMAL LOW (ref 30.0–100.0)

## 2017-10-19 LAB — HEMOGLOBINOPATHY EVALUATION
HGB A: 98.2 % (ref 96.4–98.8)
HGB C: 0 %
HGB S: 0 %
HGB VARIANT: 0 %
Hemoglobin A2 Quantitation: 1.8 % (ref 1.8–3.2)
Hemoglobin F Quantitation: 0 % (ref 0.0–2.0)

## 2017-10-19 LAB — OBSTETRIC PANEL, INCLUDING HIV
ANTIBODY SCREEN: NEGATIVE
BASOS: 0 %
Basophils Absolute: 0 10*3/uL (ref 0.0–0.2)
EOS (ABSOLUTE): 0.2 10*3/uL (ref 0.0–0.4)
Eos: 2 %
HIV SCREEN 4TH GENERATION: NONREACTIVE
Hematocrit: 37.9 % (ref 34.0–46.6)
Hemoglobin: 11.8 g/dL (ref 11.1–15.9)
Hepatitis B Surface Ag: NEGATIVE
Immature Grans (Abs): 0 10*3/uL (ref 0.0–0.1)
Immature Granulocytes: 0 %
Lymphocytes Absolute: 1.7 10*3/uL (ref 0.7–3.1)
Lymphs: 20 %
MCH: 22.1 pg — AB (ref 26.6–33.0)
MCHC: 31.1 g/dL — AB (ref 31.5–35.7)
MCV: 71 fL — AB (ref 79–97)
MONOS ABS: 0.5 10*3/uL (ref 0.1–0.9)
Monocytes: 6 %
NEUTROS ABS: 6.1 10*3/uL (ref 1.4–7.0)
Neutrophils: 72 %
Platelets: 223 10*3/uL (ref 150–379)
RBC: 5.35 x10E6/uL — AB (ref 3.77–5.28)
RDW: 17.9 % — AB (ref 12.3–15.4)
RPR Ser Ql: NONREACTIVE
Rh Factor: POSITIVE
WBC: 8.5 10*3/uL (ref 3.4–10.8)

## 2017-10-19 LAB — HEMOGLOBIN A1C
ESTIMATED AVERAGE GLUCOSE: 100 mg/dL
HEMOGLOBIN A1C: 5.1 % (ref 4.8–5.6)

## 2017-10-19 LAB — CULTURE, OB URINE

## 2017-10-19 LAB — URINE CULTURE, OB REFLEX

## 2017-10-24 ENCOUNTER — Encounter: Payer: Self-pay | Admitting: Family Medicine

## 2017-10-24 DIAGNOSIS — R7989 Other specified abnormal findings of blood chemistry: Secondary | ICD-10-CM | POA: Insufficient documentation

## 2017-10-24 DIAGNOSIS — Z283 Underimmunization status: Secondary | ICD-10-CM | POA: Insufficient documentation

## 2017-10-24 DIAGNOSIS — O09899 Supervision of other high risk pregnancies, unspecified trimester: Secondary | ICD-10-CM | POA: Insufficient documentation

## 2017-10-24 DIAGNOSIS — O99210 Obesity complicating pregnancy, unspecified trimester: Secondary | ICD-10-CM | POA: Insufficient documentation

## 2017-10-24 DIAGNOSIS — O9989 Other specified diseases and conditions complicating pregnancy, childbirth and the puerperium: Secondary | ICD-10-CM

## 2017-10-26 ENCOUNTER — Other Ambulatory Visit: Payer: Self-pay | Admitting: Certified Nurse Midwife

## 2017-10-26 DIAGNOSIS — Z3480 Encounter for supervision of other normal pregnancy, unspecified trimester: Secondary | ICD-10-CM

## 2017-10-26 DIAGNOSIS — N760 Acute vaginitis: Secondary | ICD-10-CM

## 2017-10-26 DIAGNOSIS — B9689 Other specified bacterial agents as the cause of diseases classified elsewhere: Secondary | ICD-10-CM

## 2017-10-26 DIAGNOSIS — E559 Vitamin D deficiency, unspecified: Secondary | ICD-10-CM | POA: Insufficient documentation

## 2017-10-26 MED ORDER — VITAMIN D (ERGOCALCIFEROL) 1.25 MG (50000 UNIT) PO CAPS: 50000.0000 [IU] | ORAL_CAPSULE | ORAL | 2 refills | Status: AC

## 2017-10-26 MED ORDER — METRONIDAZOLE 500 MG PO TABS: 500.0000 mg | ORAL_TABLET | Freq: Two times a day (BID) | ORAL | 0 refills | Status: DC

## 2017-10-29 LAB — INHERITEST CORE(CF97,SMA,FRAX)

## 2017-10-31 ENCOUNTER — Other Ambulatory Visit: Payer: Self-pay | Admitting: Certified Nurse Midwife

## 2017-10-31 DIAGNOSIS — Z3480 Encounter for supervision of other normal pregnancy, unspecified trimester: Secondary | ICD-10-CM

## 2017-11-14 ENCOUNTER — Ambulatory Visit (INDEPENDENT_AMBULATORY_CARE_PROVIDER_SITE_OTHER): Payer: Medicaid Other | Admitting: Obstetrics and Gynecology

## 2017-11-14 ENCOUNTER — Encounter: Payer: Self-pay | Admitting: Obstetrics and Gynecology

## 2017-11-14 ENCOUNTER — Encounter: Payer: Self-pay | Admitting: *Deleted

## 2017-11-14 VITALS — BP 119/75 | HR 118 | Wt 295.9 lb

## 2017-11-14 DIAGNOSIS — Z3482 Encounter for supervision of other normal pregnancy, second trimester: Secondary | ICD-10-CM

## 2017-11-14 DIAGNOSIS — O99210 Obesity complicating pregnancy, unspecified trimester: Secondary | ICD-10-CM

## 2017-11-14 DIAGNOSIS — Z3480 Encounter for supervision of other normal pregnancy, unspecified trimester: Secondary | ICD-10-CM

## 2017-11-14 DIAGNOSIS — O99212 Obesity complicating pregnancy, second trimester: Secondary | ICD-10-CM

## 2017-11-14 DIAGNOSIS — F322 Major depressive disorder, single episode, severe without psychotic features: Secondary | ICD-10-CM

## 2017-11-14 NOTE — Progress Notes (Signed)
Subjective:  Denise Mclaughlin is a 22 y.o. G2P1001 at [redacted]w[redacted]d being seen today for ongoing prenatal care.  She is currently monitored for the following issues for this high-risk pregnancy and has Acetaminophen overdose; Major depressive disorder, single episode, severe without psychotic features (HCC); Anxiety disorder of adolescence; Bipolar and related disorder (HCC); Obesity, Class III, BMI 40-49.9 (morbid obesity) (HCC); Cannabis abuse, continuous use; Supervision of other normal pregnancy, antepartum; H/O abdominal surgery; Obesity affecting pregnancy, antepartum; Rubella non-immune status, antepartum; Low serum vitamin D; and Vitamin D deficiency on their problem list.  Patient reports no complaints.  Contractions: Not present. Vag. Bleeding: None.   . Denies leaking of fluid.   The following portions of the patient's history were reviewed and updated as appropriate: allergies, current medications, past family history, past medical history, past social history, past surgical history and problem list. Problem list updated.  Objective:   Vitals:   11/14/17 1400  BP: 119/75  Pulse: (!) 118  Weight: 134.2 kg (295 lb 14.4 oz)    Fetal Status: Fetal Heart Rate (bpm): 152         General:  Alert, oriented and cooperative. Patient is in no acute distress.  Skin: Skin is warm and dry. No rash noted.   Cardiovascular: Normal heart rate noted  Respiratory: Normal respiratory effort, no problems with respiration noted  Abdomen: Soft, gravid, appropriate for gestational age. Pain/Pressure: Absent     Pelvic:  Cervical exam deferred        Extremities: Normal range of motion.  Edema: None  Mental Status: Normal mood and affect. Normal behavior. Normal judgment and thought content.   Urinalysis:      Assessment and Plan:  Pregnancy: G2P1001 at 2035w0d  1. Supervision of other normal pregnancy, antepartum Stable - Genetic Screening  2. Major depressive disorder, single episode, severe without  psychotic features (HCC) Previously on Wellbutrin but has been off for over 1 yr Reports no issues presently Informed pt if has problems to let us know, that we have resources for her  3. Obesity affecting pregnancy, antepartum Additional labs today  Preterm labor symptoms and general obstetric precautions including but not limited to vaginal bleeding, contractions, leaking of fluid and fetal movement were reviewed in detail with the patient. Please refer to After Visit Summary for other counseling recommendations.  Return in about 4 weeks (around 12/12/2017) for OB visit.   Hermina StaggersErvin, Kailia Starry L, MD

## 2017-11-14 NOTE — Patient Instructions (Signed)

## 2017-11-14 NOTE — Progress Notes (Signed)
Patient states that she feels light flutters, denies pain today.

## 2017-11-15 LAB — COMPREHENSIVE METABOLIC PANEL
ALBUMIN: 3.3 g/dL — AB (ref 3.5–5.5)
ALK PHOS: 73 IU/L (ref 39–117)
ALT: 11 IU/L (ref 0–32)
AST: 8 IU/L (ref 0–40)
Albumin/Globulin Ratio: 1.1 — ABNORMAL LOW (ref 1.2–2.2)
BUN / CREAT RATIO: 15 (ref 9–23)
BUN: 6 mg/dL (ref 6–20)
Bilirubin Total: <0.2 mg/dL (ref 0.0–1.2)
CALCIUM: 9 mg/dL (ref 8.7–10.2)
CO2: 21 mmol/L (ref 20–29)
CREATININE: 0.41 mg/dL — AB (ref 0.57–1.00)
Chloride: 106 mmol/L (ref 96–106)
GFR calc Af Amer: 173 mL/min/{1.73_m2} (ref >59)
GFR, EST NON AFRICAN AMERICAN: 150 mL/min/{1.73_m2} (ref >59)
GLUCOSE: 100 mg/dL — AB (ref 65–99)
Globulin, Total: 3.1 g/dL (ref 1.5–4.5)
Potassium: 4.1 mmol/L (ref 3.5–5.2)
Sodium: 140 mmol/L (ref 134–144)
TOTAL PROTEIN: 6.4 g/dL (ref 6.0–8.5)

## 2017-11-15 LAB — PROTEIN / CREATININE RATIO, URINE
CREATININE, UR: 165.1 mg/dL
PROTEIN UR: 21.1 mg/dL
PROTEIN/CREAT RATIO: 128 mg/g{creat} (ref 0–200)

## 2017-11-15 LAB — TSH: TSH: 1.28 u[IU]/mL (ref 0.450–4.500)

## 2017-11-19 LAB — TOXASSURE SELECT 13 (MW), URINE

## 2017-12-12 ENCOUNTER — Encounter: Payer: Medicaid Other | Admitting: Obstetrics & Gynecology

## 2017-12-19 ENCOUNTER — Other Ambulatory Visit: Payer: Self-pay | Admitting: Certified Nurse Midwife

## 2017-12-19 ENCOUNTER — Ambulatory Visit (HOSPITAL_COMMUNITY)
Admission: RE | Admit: 2017-12-19 | Discharge: 2017-12-19 | Disposition: A | Discharge status: Home / Self Care | Payer: Medicaid Other | Source: Ambulatory Visit | Attending: Certified Nurse Midwife | Admitting: Certified Nurse Midwife

## 2017-12-19 ENCOUNTER — Encounter: Payer: Medicaid Other | Admitting: Obstetrics and Gynecology

## 2017-12-19 ENCOUNTER — Encounter (HOSPITAL_COMMUNITY): Payer: Self-pay

## 2017-12-19 DIAGNOSIS — O99212 Obesity complicating pregnancy, second trimester: Secondary | ICD-10-CM | POA: Insufficient documentation

## 2017-12-19 DIAGNOSIS — Z363 Encounter for antenatal screening for malformations: Secondary | ICD-10-CM | POA: Diagnosis not present

## 2017-12-19 DIAGNOSIS — Z3A21 21 weeks gestation of pregnancy: Secondary | ICD-10-CM | POA: Insufficient documentation

## 2017-12-19 DIAGNOSIS — F1911 Other psychoactive substance abuse, in remission: Secondary | ICD-10-CM

## 2017-12-19 DIAGNOSIS — Z2839 Other underimmunization status: Secondary | ICD-10-CM

## 2017-12-19 DIAGNOSIS — O99210 Obesity complicating pregnancy, unspecified trimester: Secondary | ICD-10-CM

## 2017-12-19 DIAGNOSIS — Z283 Underimmunization status: Secondary | ICD-10-CM

## 2017-12-19 DIAGNOSIS — Z3480 Encounter for supervision of other normal pregnancy, unspecified trimester: Secondary | ICD-10-CM

## 2017-12-19 DIAGNOSIS — O9989 Other specified diseases and conditions complicating pregnancy, childbirth and the puerperium: Secondary | ICD-10-CM

## 2017-12-20 ENCOUNTER — Encounter: Payer: Self-pay | Admitting: Obstetrics and Gynecology

## 2017-12-20 ENCOUNTER — Other Ambulatory Visit: Payer: Self-pay | Admitting: Certified Nurse Midwife

## 2017-12-20 ENCOUNTER — Ambulatory Visit (INDEPENDENT_AMBULATORY_CARE_PROVIDER_SITE_OTHER): Payer: Medicaid Other | Admitting: Obstetrics and Gynecology

## 2017-12-20 VITALS — BP 125/83 | HR 109 | Wt 303.0 lb

## 2017-12-20 DIAGNOSIS — F322 Major depressive disorder, single episode, severe without psychotic features: Secondary | ICD-10-CM

## 2017-12-20 DIAGNOSIS — O09899 Supervision of other high risk pregnancies, unspecified trimester: Secondary | ICD-10-CM

## 2017-12-20 DIAGNOSIS — Z2839 Other underimmunization status: Secondary | ICD-10-CM

## 2017-12-20 DIAGNOSIS — O99210 Obesity complicating pregnancy, unspecified trimester: Secondary | ICD-10-CM

## 2017-12-20 DIAGNOSIS — Z3480 Encounter for supervision of other normal pregnancy, unspecified trimester: Secondary | ICD-10-CM

## 2017-12-20 DIAGNOSIS — O9989 Other specified diseases and conditions complicating pregnancy, childbirth and the puerperium: Secondary | ICD-10-CM

## 2017-12-20 DIAGNOSIS — Z283 Underimmunization status: Secondary | ICD-10-CM

## 2017-12-20 NOTE — Progress Notes (Signed)
   PRENATAL VISIT NOTE  Subjective:  Denise Mclaughlin is a 22 y.o. G2P1001 at 8895w1d being seen today for ongoing prenatal care.  She is currently monitored for the following issues for this high-risk pregnancy and has Acetaminophen overdose; Major depressive disorder, single episode, severe without psychotic features (HCC); Anxiety disorder of adolescence; Bipolar and related disorder (HCC); Obesity, Class III, BMI 40-49.9 (morbid obesity) (HCC); Cannabis abuse, continuous use; Supervision of other normal pregnancy, antepartum; H/O abdominal surgery; Obesity affecting pregnancy, antepartum; Rubella non-immune status, antepartum; Low serum vitamin D; and Vitamin D deficiency on their problem list.  Patient reports no complaints.  Contractions: Not present. Vag. Bleeding: None.  Movement: Present. Denies leaking of fluid.   The following portions of the patient's history were reviewed and updated as appropriate: allergies, current medications, past family history, past medical history, past social history, past surgical history and problem list. Problem list updated.  Objective:   Vitals:   12/20/17 1607  BP: 125/83  Pulse: (!) 109  Weight: (!) 303 lb (137.4 kg)    Fetal Status: Fetal Heart Rate (bpm): 137 Fundal Height: 21 cm Movement: Present     General:  Alert, oriented and cooperative. Patient is in no acute distress.  Skin: Skin is warm and dry. No rash noted.   Cardiovascular: Normal heart rate noted  Respiratory: Normal respiratory effort, no problems with respiration noted  Abdomen: Soft, gravid, appropriate for gestational age.  Pain/Pressure: Absent     Pelvic: Cervical exam deferred        Extremities: Normal range of motion.  Edema: None  Mental Status: Normal mood and affect. Normal behavior. Normal judgment and thought content.   Assessment and Plan:  Pregnancy: G2P1001 at 4495w1d  1. Supervision of other normal pregnancy, antepartum Patient is doing well Anatomy  ultrasound results reviewed with the patient Follow up ultrasound ordered AFP today - US MFM OB FOLLOW UP; Future - AFP, Serum, Open Spina Bifida  2. Obesity affecting pregnancy, antepartum Advised patient to monitor weight gain  3. Rubella non-immune status, antepartum Will offer pp  4. Major depressive disorder, single episode, severe without psychotic features (HCC) Stable without meds Declined referral to behavioral health  Preterm labor symptoms and general obstetric precautions including but not limited to vaginal bleeding, contractions, leaking of fluid and fetal movement were reviewed in detail with the patient. Please refer to After Visit Summary for other counseling recommendations.  Return in about 1 month (around 01/17/2018) for ROB.  No future appointments.  Catalina AntiguaPeggy Pavan Bring, MD

## 2017-12-22 LAB — AFP, SERUM, OPEN SPINA BIFIDA
AFP MOM: 0.92
AFP Value: 39.2 ng/mL
Gest. Age on Collection Date: 21.1 weeks
Maternal Age At EDD: 20.3 yr
OSBR RISK 1 IN: 10000
TEST RESULTS AFP: NEGATIVE
WEIGHT: 303 [lb_av]

## 2018-01-17 ENCOUNTER — Encounter: Payer: Medicaid Other | Admitting: Obstetrics and Gynecology

## 2018-01-21 ENCOUNTER — Ambulatory Visit (HOSPITAL_COMMUNITY)
Admission: RE | Admit: 2018-01-21 | Discharge: 2018-01-21 | Disposition: A | Discharge status: Home / Self Care | Payer: Medicaid Other | Source: Ambulatory Visit | Attending: Certified Nurse Midwife | Admitting: Certified Nurse Midwife

## 2018-01-21 ENCOUNTER — Other Ambulatory Visit: Payer: Self-pay | Admitting: Certified Nurse Midwife

## 2018-01-21 ENCOUNTER — Ambulatory Visit (INDEPENDENT_AMBULATORY_CARE_PROVIDER_SITE_OTHER): Payer: Medicaid Other | Admitting: Obstetrics and Gynecology

## 2018-01-21 ENCOUNTER — Encounter: Payer: Self-pay | Admitting: Obstetrics and Gynecology

## 2018-01-21 VITALS — BP 136/79 | HR 108 | Wt 313.0 lb

## 2018-01-21 DIAGNOSIS — Z3480 Encounter for supervision of other normal pregnancy, unspecified trimester: Secondary | ICD-10-CM

## 2018-01-21 DIAGNOSIS — O359XX0 Maternal care for (suspected) fetal abnormality and damage, unspecified, not applicable or unspecified: Secondary | ICD-10-CM

## 2018-01-21 DIAGNOSIS — E669 Obesity, unspecified: Secondary | ICD-10-CM | POA: Diagnosis not present

## 2018-01-21 DIAGNOSIS — O99210 Obesity complicating pregnancy, unspecified trimester: Secondary | ICD-10-CM

## 2018-01-21 DIAGNOSIS — O09899 Supervision of other high risk pregnancies, unspecified trimester: Secondary | ICD-10-CM

## 2018-01-21 DIAGNOSIS — Z362 Encounter for other antenatal screening follow-up: Secondary | ICD-10-CM

## 2018-01-21 DIAGNOSIS — Z3A25 25 weeks gestation of pregnancy: Secondary | ICD-10-CM

## 2018-01-21 DIAGNOSIS — O9989 Other specified diseases and conditions complicating pregnancy, childbirth and the puerperium: Secondary | ICD-10-CM

## 2018-01-21 DIAGNOSIS — O99212 Obesity complicating pregnancy, second trimester: Secondary | ICD-10-CM

## 2018-01-21 DIAGNOSIS — Z283 Underimmunization status: Secondary | ICD-10-CM

## 2018-01-21 NOTE — Progress Notes (Signed)
   PRENATAL VISIT NOTE  Subjective:  Denise Mclaughlin is a 22 y.o. G2P1001 at [redacted]w[redacted]d being seen today for ongoing prenatal care.  She is currently monitored for the following issues for this high-risk pregnancy and has Acetaminophen overdose; Major depressive disorder, single episode, severe without psychotic features (HCC); Anxiety disorder of adolescence; Bipolar and related disorder (HCC); Obesity, Class III, BMI 40-49.9 (morbid obesity) (HCC); Cannabis abuse, continuous use; Supervision of other normal pregnancy, antepartum; H/O abdominal surgery; Obesity affecting pregnancy, antepartum; Rubella non-immune status, antepartum; Low serum vitamin D; and Vitamin D deficiency on their problem list.  Patient reports no complaints.  Contractions: Not present. Vag. Bleeding: None.  Movement: Present. Denies leaking of fluid.   The following portions of the patient's history were reviewed and updated as appropriate: allergies, current medications, past family history, past medical history, past social history, past surgical history and problem list. Problem list updated.  Objective:   Vitals:   01/21/18 1446  BP: 136/79  Pulse: (!) 108  Weight: (!) 313 lb (142 kg)    Fetal Status: Fetal Heart Rate (bpm): 148(Simultaneous filing. User may not have seen previous data.) Fundal Height: 26 cm Movement: Present     General:  Alert, oriented and cooperative. Patient is in no acute distress.  Skin: Skin is warm and dry. No rash noted.   Cardiovascular: Normal heart rate noted  Respiratory: Normal respiratory effort, no problems with respiration noted  Abdomen: Soft, gravid, appropriate for gestational age.  Pain/Pressure: Absent     Pelvic: Cervical exam deferred        Extremities: Normal range of motion.  Edema: None  Mental Status: Normal mood and affect. Normal behavior. Normal judgment and thought content.   Assessment and Plan:  Pregnancy: G2P1001 at [redacted]w[redacted]d  1. Supervision of other normal  pregnancy, antepartum Patient is doing well without complaints Follow up ultrasound done today- report not yet available Third trimester labs, glucola and tdap next visit  2. Obesity affecting pregnancy, antepartum Discussed excessive weight gain with the patient  3. Rubella non-immune status, antepartum Will offer pp  Preterm labor symptoms and general obstetric precautions including but not limited to vaginal bleeding, contractions, leaking of fluid and fetal movement were reviewed in detail with the patient. Please refer to After Visit Summary for other counseling recommendations.  Return in about 3 weeks (around 02/11/2018) for ROB, 2 hr glucola next visit.  Future Appointments  Date Time Provider Department Center  01/21/2018  3:30 PM Jahan Friedlander, Gigi Gin, MD [facility] None  02/12/2018  8:30 AM [facility] None  02/12/2018  8:45 AM Conan Bowens, MD [facility] None    Catalina Antigua, MD

## 2018-01-21 NOTE — Progress Notes (Signed)
Patient reports fetal movement, denies pain. 

## 2018-01-22 ENCOUNTER — Other Ambulatory Visit: Payer: Self-pay | Admitting: Certified Nurse Midwife

## 2018-01-22 DIAGNOSIS — O99210 Obesity complicating pregnancy, unspecified trimester: Secondary | ICD-10-CM

## 2018-01-22 DIAGNOSIS — Z3480 Encounter for supervision of other normal pregnancy, unspecified trimester: Secondary | ICD-10-CM

## 2018-02-12 ENCOUNTER — Other Ambulatory Visit: Payer: Medicaid Other

## 2018-02-12 ENCOUNTER — Encounter: Payer: Self-pay | Admitting: Obstetrics and Gynecology

## 2018-02-12 ENCOUNTER — Ambulatory Visit (INDEPENDENT_AMBULATORY_CARE_PROVIDER_SITE_OTHER): Payer: Medicaid Other | Admitting: Obstetrics and Gynecology

## 2018-02-12 VITALS — BP 109/70 | HR 114 | Wt 320.0 lb

## 2018-02-12 DIAGNOSIS — F322 Major depressive disorder, single episode, severe without psychotic features: Secondary | ICD-10-CM

## 2018-02-12 DIAGNOSIS — O99210 Obesity complicating pregnancy, unspecified trimester: Secondary | ICD-10-CM

## 2018-02-12 DIAGNOSIS — Z2839 Other underimmunization status: Secondary | ICD-10-CM

## 2018-02-12 DIAGNOSIS — Z3480 Encounter for supervision of other normal pregnancy, unspecified trimester: Secondary | ICD-10-CM

## 2018-02-12 DIAGNOSIS — Z283 Underimmunization status: Secondary | ICD-10-CM

## 2018-02-12 DIAGNOSIS — O9989 Other specified diseases and conditions complicating pregnancy, childbirth and the puerperium: Secondary | ICD-10-CM

## 2018-02-12 NOTE — Addendum Note (Signed)
Addended by: Dalphine HandingGARDNER, Pride Gonzales L on: 02/12/2018 09:35 AM   Modules accepted: Orders

## 2018-02-12 NOTE — Progress Notes (Signed)
   PRENATAL VISIT NOTE  Subjective:  Denise Mclaughlin is a 22 y.o. G2P1001 at 77w6dbeing seen today for ongoing prenatal care.  She is currently monitored for the following issues for this high-risk pregnancy and has Acetaminophen overdose; Major depressive disorder, single episode, severe without psychotic features ([place]; Anxiety disorder of adolescence; Bipolar and related disorder ([place]; Obesity, Class III, BMI 40-49.9 (morbid obesity) ([place]; Cannabis abuse, continuous use; Supervision of other normal pregnancy, antepartum; H/O abdominal surgery; Obesity affecting pregnancy, antepartum; Rubella non-immune status, antepartum; Low serum vitamin D; and Vitamin D deficiency on their problem list.  Patient reports no complaints.  Contractions: Not present. Vag. Bleeding: None.  Movement: Present. Denies leaking of fluid.   The following portions of the patient's history were reviewed and updated as appropriate: allergies, current medications, past family history, past medical history, past social history, past surgical history and problem list. Problem list updated.  Objective:   Vitals:   02/12/18 0854  BP: 109/70  Pulse: (!) 114  Weight: (!) 320 lb (145.2 kg)    Fetal Status: Fetal Heart Rate (bpm): 152   Movement: Present     General:  Alert, oriented and cooperative. Patient is in no acute distress.  Skin: Skin is warm and dry. No rash noted.   Cardiovascular: Normal heart rate noted  Respiratory: Normal respiratory effort, no problems with respiration noted  Abdomen: Soft, gravid, appropriate for gestational age.  Pain/Pressure: Absent     Pelvic: Cervical exam deferred        Extremities: Normal range of motion.  Edema: None  Mental Status: Normal mood and affect. Normal behavior. Normal judgment and thought content.   Assessment and Plan:  Pregnancy: G2P1001 at 239w6d1. Supervision of other normal pregnancy, antepartum - 2 hr GTT - CBC - RPR - HIV Reviewed [place]  patient, she is worried about risk of Down syndrome given thickened nuchal fold, normal cffDNA  2. Obesity affecting pregnancy, antepartum Baseline labs wnl  3. Rubella non-immune status, antepartum MMR pp  4. Major depressive disorder, single episode, severe without psychotic features ([place]  Preterm labor symptoms and general obstetric precautions including but not limited to vaginal bleeding, contractions, leaking of fluid and fetal movement were reviewed in detail with the patient. Please refer to After Visit Summary for other counseling recommendations.  Return in about 2 weeks (around 02/26/2018) for OB visit.  Future Appointments  Date Time Provider [place]/14/2019  1:45 PM [facility] [place] [facility]    KeSloan LeiterMD

## 2018-02-13 LAB — GLUCOSE TOLERANCE, 2 HOURS W/ 1HR
GLUCOSE, 1 HOUR: 151 mg/dL (ref 65–179)
GLUCOSE, FASTING: 86 mg/dL (ref 65–91)
Glucose, 2 hour: 112 mg/dL (ref 65–152)

## 2018-02-13 LAB — CBC
Hematocrit: 31 % — ABNORMAL LOW (ref 34.0–46.6)
Hemoglobin: 9.4 g/dL — ABNORMAL LOW (ref 11.1–15.9)
MCH: 21.8 pg — ABNORMAL LOW (ref 26.6–33.0)
MCHC: 30.3 g/dL — ABNORMAL LOW (ref 31.5–35.7)
MCV: 72 fL — ABNORMAL LOW (ref 79–97)
PLATELETS: 139 10*3/uL — AB (ref 150–450)
RBC: 4.32 x10E6/uL (ref 3.77–5.28)
RDW: 16.8 % — ABNORMAL HIGH (ref 12.3–15.4)
WBC: 12.1 10*3/uL — ABNORMAL HIGH (ref 3.4–10.8)

## 2018-02-13 LAB — HIV ANTIBODY (ROUTINE TESTING W REFLEX): HIV SCREEN 4TH GENERATION: NONREACTIVE

## 2018-02-13 LAB — RPR: RPR: NONREACTIVE

## 2018-02-13 MED ORDER — FERROUS SULFATE 325 (65 FE) MG PO TABS: 325.0000 mg | ORAL_TABLET | Freq: Two times a day (BID) | ORAL | 1 refills | Status: AC

## 2018-02-13 NOTE — Addendum Note (Signed)
Addended by: Leroy LibmanAVIS, Supriya Beaston on: 02/13/2018 05:58 PM   Modules accepted: Orders

## 2018-02-17 ENCOUNTER — Inpatient Hospital Stay (HOSPITAL_COMMUNITY)
Admission: AD | Admit: 2018-02-17 | Discharge: 2018-02-17 | Disposition: A | Discharge status: Home / Self Care | Payer: Medicaid Other | Source: Ambulatory Visit | Attending: Obstetrics & Gynecology | Admitting: Obstetrics & Gynecology

## 2018-02-17 ENCOUNTER — Encounter (HOSPITAL_COMMUNITY): Payer: Self-pay

## 2018-02-17 DIAGNOSIS — Z3A29 29 weeks gestation of pregnancy: Secondary | ICD-10-CM | POA: Insufficient documentation

## 2018-02-17 DIAGNOSIS — O9989 Other specified diseases and conditions complicating pregnancy, childbirth and the puerperium: Secondary | ICD-10-CM | POA: Diagnosis not present

## 2018-02-17 DIAGNOSIS — O99613 Diseases of the digestive system complicating pregnancy, third trimester: Secondary | ICD-10-CM | POA: Insufficient documentation

## 2018-02-17 DIAGNOSIS — Z87891 Personal history of nicotine dependence: Secondary | ICD-10-CM | POA: Insufficient documentation

## 2018-02-17 DIAGNOSIS — O212 Late vomiting of pregnancy: Secondary | ICD-10-CM | POA: Diagnosis present

## 2018-02-17 DIAGNOSIS — O4703 False labor before 37 completed weeks of gestation, third trimester: Secondary | ICD-10-CM

## 2018-02-17 DIAGNOSIS — A084 Viral intestinal infection, unspecified: Secondary | ICD-10-CM | POA: Insufficient documentation

## 2018-02-17 LAB — COMPREHENSIVE METABOLIC PANEL
ALT: 13 U/L — ABNORMAL LOW (ref 14–54)
AST: 13 U/L — ABNORMAL LOW (ref 15–41)
Albumin: 2.6 g/dL — ABNORMAL LOW (ref 3.5–5.0)
Alkaline Phosphatase: 103 U/L (ref 38–126)
Anion gap: 10 (ref 5–15)
BUN: 11 mg/dL (ref 6–20)
CHLORIDE: 106 mmol/L (ref 101–111)
CO2: 20 mmol/L — AB (ref 22–32)
Calcium: 8.5 mg/dL — ABNORMAL LOW (ref 8.9–10.3)
Creatinine, Ser: 0.46 mg/dL (ref 0.44–1.00)
Glucose, Bld: 110 mg/dL — ABNORMAL HIGH (ref 65–99)
POTASSIUM: 3.6 mmol/L (ref 3.5–5.1)
SODIUM: 136 mmol/L (ref 135–145)
Total Bilirubin: <0.1 mg/dL — ABNORMAL LOW (ref 0.3–1.2)
Total Protein: 7 g/dL (ref 6.5–8.1)

## 2018-02-17 LAB — CBC
HCT: 30 % — ABNORMAL LOW (ref 36.0–46.0)
Hemoglobin: 9.2 g/dL — ABNORMAL LOW (ref 12.0–15.0)
MCH: 22 pg — AB (ref 26.0–34.0)
MCHC: 30.7 g/dL (ref 30.0–36.0)
MCV: 71.8 fL — AB (ref 78.0–100.0)
PLATELETS: 173 10*3/uL (ref 150–400)
RBC: 4.18 MIL/uL (ref 3.87–5.11)
RDW: 17.6 % — AB (ref 11.5–15.5)
WBC: 16.3 10*3/uL — AB (ref 4.0–10.5)

## 2018-02-17 MED ORDER — LACTATED RINGERS IV BOLUS
1000.0000 mL | Freq: Once | INTRAVENOUS | Status: AC
  Administered 2018-02-17: 1000 mL via INTRAVENOUS

## 2018-02-17 MED ORDER — ONDANSETRON 8 MG PO TBDP: 8.0000 mg | ORAL_TABLET | Freq: Three times a day (TID) | ORAL | 0 refills | Status: AC | PRN

## 2018-02-17 NOTE — Discharge Instructions (Signed)
Food Choices to Help Relieve Diarrhea, Adult °When you have diarrhea, the foods you eat and your eating habits are very important. Choosing the right foods and drinks can help: °· Relieve diarrhea. °· Replace lost fluids and nutrients. °· Prevent dehydration. ° °What general guidelines should I follow? °Relieving diarrhea °· Choose foods with less than 2 g or .07 oz. of fiber per serving. °· Limit fats to less than 8 tsp (38 g or 1.34 oz.) a day. °· Avoid the following: °? Foods and beverages sweetened with high-fructose corn syrup, honey, or sugar alcohols such as xylitol, sorbitol, and mannitol. °? Foods that contain a lot of fat or sugar. °? Fried, greasy, or spicy foods. °? High-fiber grains, breads, and cereals. °? Raw fruits and vegetables. °· Eat foods that are rich in probiotics. These foods include dairy products such as yogurt and fermented milk products. They help increase healthy bacteria in the stomach and intestines (gastrointestinal tract, or GI tract). °· If you have lactose intolerance, avoid dairy products. These may make your diarrhea worse. °· Take medicine to help stop diarrhea (antidiarrheal medicine) only as told by your health care provider. °Replacing nutrients °· Eat small meals or snacks every 3-4 hours. °· Eat bland foods, such as white rice, toast, or baked potato, until your diarrhea starts to get better. Gradually reintroduce nutrient-rich foods as tolerated or as told by your health care provider. This includes: °? Well-cooked protein foods. °? Peeled, seeded, and soft-cooked fruits and vegetables. °? Low-fat dairy products. °· Take vitamin and mineral supplements as told by your health care provider. °Preventing dehydration ° °· Start by sipping water or a special solution to prevent dehydration (oral rehydration solution, ORS). Urine that is clear or pale yellow means that you are getting enough fluid. °· Try to drink at least 8-10 cups of fluid each day to help replace lost  fluids. °· You may add other liquids in addition to water, such as clear juice or decaffeinated sports drinks, as tolerated or as told by your health care provider. °· Avoid drinks with caffeine, such as coffee, tea, or soft drinks. °· Avoid alcohol. °What foods are recommended? °The items listed may not be a complete list. Talk with your health care provider about what dietary choices are best for you. °Grains °White rice. White, French, or pita breads (fresh or toasted), including plain rolls, buns, or bagels. White pasta. Saltine, soda, or graham crackers. Pretzels. Low-fiber cereal. Cooked cereals made with water (such as cornmeal, farina, or cream cereals). Plain muffins. Matzo. Melba toast. Zwieback. °Vegetables °Potatoes (without the skin). Most well-cooked and canned vegetables without skins or seeds. Tender lettuce. °Fruits °Apple sauce. Fruits canned in juice. Cooked apricots, cherries, grapefruit, peaches, pears, or plums. Fresh bananas and cantaloupe. °Meats and other protein foods °Baked or boiled chicken. Eggs. Tofu. Fish. Seafood. Smooth nut butters. Ground or well-cooked tender beef, ham, veal, lamb, pork, or poultry. °Dairy °Plain yogurt, kefir, and unsweetened liquid yogurt. Lactose-free milk, buttermilk, skim milk, or soy milk. Low-fat or nonfat hard cheese. °Beverages °Water. Low-calorie sports drinks. Fruit juices without pulp. Strained tomato and vegetable juices. Decaffeinated teas. Sugar-free beverages not sweetened with sugar alcohols. Oral rehydration solutions, if approved by your health care provider. °Seasoning and other foods °Bouillon, broth, or soups made from recommended foods. °What foods are not recommended? °The items listed may not be a complete list. Talk with your health care provider about what dietary choices are best for you. °Grains °Whole grain, whole wheat,   bran, or rye breads, rolls, pastas, and crackers. Wild or brown rice. Whole grain or bran cereals. Barley. Oats and  oatmeal. Corn tortillas or taco shells. Granola. Popcorn. °Vegetables °Raw vegetables. Fried vegetables. Cabbage, broccoli, Brussels sprouts, artichokes, baked beans, beet greens, corn, kale, legumes, peas, sweet potatoes, and yams. Potato skins. Cooked spinach and cabbage. °Fruits °Dried fruit, including raisins and dates. Raw fruits. Stewed or dried prunes. Canned fruits with syrup. °Meat and other protein foods °Fried or fatty meats. Deli meats. Chunky nut butters. Nuts and seeds. Beans and lentils. Bacon. Hot dogs. Sausage. °Dairy °High-fat cheeses. Whole milk, chocolate milk, and beverages made with milk, such as milk shakes. Half-and-half. Cream. sour cream. Ice cream. °Beverages °Caffeinated beverages (such as coffee, tea, soda, or energy drinks). Alcoholic beverages. Fruit juices with pulp. Prune juice. Soft drinks sweetened with high-fructose corn syrup or sugar alcohols. High-calorie sports drinks. °Fats and oils °Butter. Cream sauces. Margarine. Salad oils. Plain salad dressings. Olives. Avocados. Mayonnaise. °Sweets and desserts °Sweet rolls, doughnuts, and sweet breads. Sugar-free desserts sweetened with sugar alcohols such as xylitol and sorbitol. °Seasoning and other foods °Honey. Hot sauce. Chili powder. Gravy. Cream-based or milk-based soups. Pancakes and waffles. °Summary °· When you have diarrhea, the foods you eat and your eating habits are very important. °· Make sure you get at least 8-10 cups of fluid each day, or enough to keep your urine clear or pale yellow. °· Eat bland foods and gradually reintroduce healthy, nutrient-rich foods as tolerated, or as told by your health care provider. °· Avoid high-fiber, fried, greasy, or spicy foods. °This information is not intended to replace advice given to you by your health care provider. Make sure you discuss any questions you have with your health care provider. °Document Released: 11/18/2003 Document Revised: 08/25/2016 Document Reviewed:  08/25/2016 °Elsevier Interactive Patient Education © 2018 [employer] °Viral Gastroenteritis, Adult °Viral gastroenteritis is also known as the stomach flu. This condition is caused by certain germs (viruses). These germs can be passed from person to person very easily (are very contagious). This condition can cause sudden watery poop (diarrhea), fever, and throwing up (vomiting). °Having watery poop and throwing up can make you feel weak and cause you to get dehydrated. Dehydration can make you tired and thirsty, make you have a dry mouth, and make it so you pee (urinate) less often. Older adults and people with other diseases or a weak defense system (immune system) are at higher risk for dehydration. It is important to replace the fluids that you lose from having watery poop and throwing up. °Follow these instructions at home: °Follow instructions from your doctor about how to care for yourself at home. °Eating and drinking ° °Follow these instructions as told by your doctor: °· Take an oral rehydration solution (ORS). This is a drink that is sold at pharmacies and stores. °· Drink clear fluids in small amounts as you are able, such as: °? Water. °? Ice chips. °? Diluted fruit juice. °? Low-calorie sports drinks. °· Eat bland, easy-to-digest foods in small amounts as you are able, such as: °? Bananas. °? Applesauce. °? Rice. °? Low-fat (lean) meats. °? Toast. °? Crackers. °· Avoid fluids that have a lot of sugar or caffeine in them. °· Avoid alcohol. °· Avoid spicy or fatty foods. ° °General instructions °· Drink enough fluid to keep your pee (urine) clear or pale yellow. °· Wash your hands often. If you cannot use soap and water, use hand sanitizer. °· Make sure   that all people in your home wash their hands well and often. °· Rest at home while you get better. °· Take over-the-counter and prescription medicines only as told by your doctor. °· Watch your condition for any changes. °· Take a warm bath to help  with any burning or pain from having watery poop. °· Keep all follow-up visits as told by your doctor. This is important. °Contact a doctor if: °· You cannot keep fluids down. °· Your symptoms get worse. °· You have new symptoms. °· You feel light-headed or dizzy. °· You have muscle cramps. °Get help right away if: °· You have chest pain. °· You feel very weak or you pass out (faint). °· You see blood in your throw-up. °· Your throw-up looks like coffee grounds. °· You have bloody or black poop (stools) or poop that look like tar. °· You have a very bad headache, a stiff neck, or both. °· You have a rash. °· You have very bad pain, cramping, or bloating in your belly (abdomen). °· You have trouble breathing. °· You are breathing very quickly. °· Your heart is beating very quickly. °· Your skin feels cold and clammy. °· You feel confused. °· You have pain when you pee. °· You have signs of dehydration, such as: °? Dark pee, hardly any pee, or no pee. °? Cracked lips. °? Dry mouth. °? Sunken eyes. °? Sleepiness. °? Weakness. °This information is not intended to replace advice given to you by your health care provider. Make sure you discuss any questions you have with your health care provider. °Document Released: 02/14/2008 Document Revised: 03/17/2016 Document Reviewed: 05/04/2015 °Elsevier Interactive Patient Education © 2017 [employer] ° °Safe Medications in Pregnancy  ° °Acne: °Benzoyl Peroxide °Salicylic Acid ° °Backache/Headache: °Tylenol: 2 regular strength every 4 hours OR °             2 Extra strength every 6 hours ° °Colds/Coughs/Allergies: °Benadryl (alcohol free) 25 mg every 6 hours as needed °Breath right strips °Claritin °Cepacol throat lozenges °Chloraseptic throat spray °Cold-Eeze- up to three times per day °Cough drops, alcohol free °Flonase (by prescription only) °Guaifenesin °Mucinex °Robitussin DM (plain only, alcohol free) °Saline nasal spray/drops °Sudafed (pseudoephedrine) & Actifed ** use  only after [redacted] weeks gestation and if you do not have high blood pressure °Tylenol °Vicks Vaporub °Zinc lozenges °Zyrtec  ° °Constipation: °Colace °Ducolax suppositories °Fleet enema °Glycerin suppositories °Metamucil °Milk of magnesia °Miralax °Senokot °Smooth move tea ° °Diarrhea: °Kaopectate °Imodium A-D ° °*NO pepto Bismol ° °Hemorrhoids: °Anusol °Anusol HC °Preparation H °Tucks ° °Indigestion: °Tums °Maalox °Mylanta °Zantac  °Pepcid ° °Insomnia: °Benadryl (alcohol free) 25mg every 6 hours as needed °Tylenol PM °Unisom, no Gelcaps ° °Leg Cramps: °Tums °MagGel ° °Nausea/Vomiting:  °Bonine °Dramamine °Emetrol °Ginger extract °Sea bands °Meclizine  °Nausea medication to take during pregnancy:  °Unisom (doxylamine succinate 25 mg tablets) Take one tablet daily at bedtime. If symptoms are not adequately controlled, the dose can be increased to a maximum recommended dose of two tablets daily (1/2 tablet in the morning, 1/2 tablet mid-afternoon and one at bedtime). °Vitamin B6 100mg tablets. Take one tablet twice a day (up to 200 mg per day). ° °Skin Rashes: °Aveeno products °Benadryl cream or 25mg every 6 hours as needed °Calamine Lotion °1% cortisone cream ° °Yeast infection: °Gyne-lotrimin 7 °Monistat 7 ° ° °**If taking multiple medications, please check labels to avoid duplicating the same active ingredients °**take medication as directed on the label °**   Do not exceed 4000 mg of tylenol in 24 hours °**Do not take medications that contain aspirin or ibuprofen ° ° ° ° °

## 2018-02-17 NOTE — MAU Provider Note (Signed)
History     CSN: [id number]  Arrival date and time: 02/17/18 0540   First Provider Initiated Contact with Patient 02/17/18 [phone]      Chief Complaint  Patient presents with  . Emesis  . Diarrhea  . Abdominal Pain    HPI Denise Mclaughlin is a 22 y.o. G2P1001 at [redacted]w[redacted]d who presents via EMS with nausea, vomiting and diarrhea. She states the symptoms started yesterday and she has had more than 10 episodes of vomiting and diarrhea in the last 24 hours. She states her son has similar symptoms. She also reports abdominal pain that started around 3am. She is unsure how often she is having pain but rates it a 7/10. She has not tried anything for the pain. She denies any leaking or bleeding. Reports good fetal movement.   OB History    Gravida  2   Para  1   Term  1   Preterm      AB      Living  1     SAB      TAB      Ectopic      Multiple  0   Live Births  1           Past Medical History:  Diagnosis Date  . ADHD (attention deficit hyperactivity disorder)   . Anxiety   . 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 Bipolar and related disorder (HCC) 05/21/2015  . Bipolar disorder (HCC)   . Cannabis abuse, continuous use 05/21/2015  . Conduct disorder 05/21/2015  . Depression   . Obesity 05/21/2015  . Suicidal ideation 05/21/2015  . Urinary tract infection     Past Surgical History:  Procedure Laterality Date  . ABDOMINAL SURGERY     ?pyloric stenosis  . MOUTH SURGERY      Family History  Problem Relation Age of Onset  . Hypertension Father   . Depression Mother     Social History   Tobacco Use  . Smoking status: Former Smoker    Types: Cigarettes    Last attempt to quit: 09/12/2016    Years since quitting: 1.4  . Smokeless tobacco: Never Used  Substance Use Topics  . Alcohol use: No  . Drug use: Not Currently    Types: Marijuana    Comment: stopped with 1st pregnancy    Allergies: No Known Allergies  Medications Prior to Admission  Medication Sig  Dispense Refill Last Dose  . ferrous sulfate (FERROUSUL) 325 (65 FE) MG tablet Take 1 tablet (325 mg total) by mouth 2 (two) times daily. 60 tablet 1 02/16/2018 at Unknown time  . omeprazole (PRILOSEC) 20 MG capsule Take 1 capsule (20 mg total) by mouth 2 (two) times daily before a meal. 60 capsule 5 02/16/2018 at Unknown time  . Prenat-FeAsp-Meth-FA-DHA w/o A (PRENATE PIXIE) 10-0.6-0.4-200 MG CAPS Take 1 tablet by mouth daily. 30 capsule 12 02/16/2018 at Unknown time  . Vitamin D, Ergocalciferol, (DRISDOL) 50000 units CAPS capsule Take 1 capsule (50,000 Units total) by mouth every 7 (seven) days. 30 capsule 2 02/16/2018 at Unknown time    Review of Systems  Constitutional: Negative.  Negative for fatigue and fever.  HENT: Negative.   Respiratory: Negative.  Negative for shortness of breath.   Cardiovascular: Negative.  Negative for chest pain.  Gastrointestinal: Positive for abdominal pain, diarrhea, nausea and vomiting. Negative for constipation.  Genitourinary: Negative.  Negative for dysuria, vaginal bleeding and vaginal discharge.  Neurological: Negative.  Negative for  dizziness and headaches.   Physical Exam   Blood pressure (!) 114/57, pulse (!) 103, temperature 97.8 F (36.6 C), temperature source Oral, resp. rate 20, last menstrual period 07/25/2017, SpO2 98 %, unknown if currently breastfeeding.  Physical Exam  Nursing note and vitals reviewed. Constitutional: She is oriented to person, place, and time. She appears well-developed and well-nourished. No distress.  HENT:  Head: Normocephalic.  Eyes: Pupils are equal, round, and reactive to light.  Cardiovascular: Normal rate, regular rhythm and normal heart sounds.  Respiratory: Effort normal and breath sounds normal. No respiratory distress.  GI: Soft. Bowel sounds are normal. She exhibits no distension. There is no tenderness.  Neurological: She is alert and oriented to person, place, and time.  Skin: Skin is warm and dry.   Psychiatric: She has a normal mood and affect. Her behavior is normal. Judgment and thought content normal.   Fetal Tracing:  Baseline: 130 Variability: moderate Accels: 10x10  Decels: none  Toco: occasional uc's  Dilation: Closed Effacement (%): Thick Cervical Position: Posterior Exam by:: Ma Hillock. Neill CNM  MAU Course  Procedures Results for orders placed or performed during the hospital encounter of 02/17/18 (from the past 24 hour(s))  CBC     Status: Abnormal   Collection Time: 02/17/18  6:31 AM  Result Value Ref Range   WBC 16.3 (H) 4.0 - 10.5 K/uL   RBC 4.18 3.87 - 5.11 MIL/uL   Hemoglobin 9.2 (L) 12.0 - 15.0 g/dL   HCT 04.530.0 (L) 40.936.0 - 81.146.0 %   MCV 71.8 (L) 78.0 - 100.0 fL   MCH 22.0 (L) 26.0 - 34.0 pg   MCHC 30.7 30.0 - 36.0 g/dL   RDW 91.417.6 (H) 78.211.5 - 95.615.5 %   Platelets 173 150 - 400 K/uL  Comprehensive metabolic panel     Status: Abnormal   Collection Time: 02/17/18  6:31 AM  Result Value Ref Range   Sodium 136 135 - 145 mmol/L   Potassium 3.6 3.5 - 5.1 mmol/L   Chloride 106 101 - 111 mmol/L   CO2 20 (L) 22 - 32 mmol/L   Glucose, Bld 110 (H) 65 - 99 mg/dL   BUN 11 6 - 20 mg/dL   Creatinine, Ser 2.130.46 0.44 - 1.00 mg/dL   Calcium 8.5 (L) 8.9 - 10.3 mg/dL   Total Protein 7.0 6.5 - 8.1 g/dL   Albumin 2.6 (L) 3.5 - 5.0 g/dL   AST 13 (L) 15 - 41 U/L   ALT 13 (L) 14 - 54 U/L   Alkaline Phosphatase 103 38 - 126 U/L   Total Bilirubin <0.1 (L) 0.3 - 1.2 mg/dL   GFR calc non Af Amer >60 >60 mL/min   GFR calc Af Amer >60 >60 mL/min   Anion gap 10 5 - 15   MDM UA- patient unable to leave sample CBC, CMP LR bolus  Contractions resolved with IV fluids. Patient reports relief from pain. Offered patient nausea medication and patient declined. No episodes of vomiting or diarrhea while in MAU. Able to tolerate PO fluids  Assessment and Plan   1. Viral gastroenteritis   2. [redacted] weeks gestation of pregnancy   3. Threatened premature labor in third trimester     -Discharge home in stable condition -Rx for zofran sent to patient's pharmacy -Preterm labor and gastroenteritis  precautions discussed -[facility] -Patient may return to MAU as needed or if her condition were to change or  worsen  Rolm Bookbinder CNM 02/17/2018, 6:07 AM

## 2018-02-17 NOTE — MAU Note (Signed)
Pt in via EMS reports N/V/D x24 hours. States she has had 10+ watery stools in 24 hours and has vomited 6x in last 24 hours. Pt states son has diarrhea that started yesterday, but no vomiting. Pt reports abdominal pain and back pain that started around 3am. Pt denies fever. Denies LOF or vaginal bleeding. Reports good fetal movement.

## 2018-02-22 ENCOUNTER — Ambulatory Visit (HOSPITAL_COMMUNITY)
Admission: RE | Admit: 2018-02-22 | Discharge: 2018-02-22 | Disposition: A | Discharge status: Home / Self Care | Payer: Medicaid Other | Source: Ambulatory Visit | Attending: Certified Nurse Midwife | Admitting: Certified Nurse Midwife

## 2018-02-22 DIAGNOSIS — O99213 Obesity complicating pregnancy, third trimester: Secondary | ICD-10-CM | POA: Insufficient documentation

## 2018-02-22 DIAGNOSIS — O403XX0 Polyhydramnios, third trimester, not applicable or unspecified: Secondary | ICD-10-CM | POA: Diagnosis not present

## 2018-02-22 DIAGNOSIS — Z3480 Encounter for supervision of other normal pregnancy, unspecified trimester: Secondary | ICD-10-CM

## 2018-02-22 DIAGNOSIS — Z3689 Encounter for other specified antenatal screening: Secondary | ICD-10-CM | POA: Diagnosis present

## 2018-02-22 DIAGNOSIS — Z3A30 30 weeks gestation of pregnancy: Secondary | ICD-10-CM | POA: Diagnosis not present

## 2018-02-22 DIAGNOSIS — O359XX0 Maternal care for (suspected) fetal abnormality and damage, unspecified, not applicable or unspecified: Secondary | ICD-10-CM | POA: Insufficient documentation

## 2018-02-22 DIAGNOSIS — O99210 Obesity complicating pregnancy, unspecified trimester: Secondary | ICD-10-CM

## 2018-02-22 DIAGNOSIS — E669 Obesity, unspecified: Secondary | ICD-10-CM | POA: Diagnosis not present

## 2018-02-25 ENCOUNTER — Other Ambulatory Visit (HOSPITAL_COMMUNITY): Payer: Self-pay | Admitting: *Deleted

## 2018-02-25 DIAGNOSIS — O409XX0 Polyhydramnios, unspecified trimester, not applicable or unspecified: Secondary | ICD-10-CM

## 2018-02-26 ENCOUNTER — Encounter (HOSPITAL_COMMUNITY): Payer: Self-pay

## 2018-02-26 ENCOUNTER — Ambulatory Visit (INDEPENDENT_AMBULATORY_CARE_PROVIDER_SITE_OTHER): Payer: Medicaid Other | Admitting: Obstetrics & Gynecology

## 2018-02-26 ENCOUNTER — Other Ambulatory Visit (HOSPITAL_COMMUNITY): Payer: Self-pay | Admitting: Maternal and Fetal Medicine

## 2018-02-26 ENCOUNTER — Ambulatory Visit (HOSPITAL_COMMUNITY)
Admission: RE | Admit: 2018-02-26 | Discharge: 2018-02-26 | Disposition: A | Discharge status: Home / Self Care | Payer: Medicaid Other | Source: Ambulatory Visit | Attending: Certified Nurse Midwife | Admitting: Certified Nurse Midwife

## 2018-02-26 ENCOUNTER — Other Ambulatory Visit: Payer: Self-pay | Admitting: Certified Nurse Midwife

## 2018-02-26 VITALS — BP 113/72 | HR 111 | Wt 325.0 lb

## 2018-02-26 DIAGNOSIS — Z3A30 30 weeks gestation of pregnancy: Secondary | ICD-10-CM

## 2018-02-26 DIAGNOSIS — O403XX0 Polyhydramnios, third trimester, not applicable or unspecified: Secondary | ICD-10-CM | POA: Insufficient documentation

## 2018-02-26 DIAGNOSIS — O09899 Supervision of other high risk pregnancies, unspecified trimester: Secondary | ICD-10-CM

## 2018-02-26 DIAGNOSIS — Z283 Underimmunization status: Secondary | ICD-10-CM

## 2018-02-26 DIAGNOSIS — O358XX0 Maternal care for other (suspected) fetal abnormality and damage, not applicable or unspecified: Secondary | ICD-10-CM | POA: Insufficient documentation

## 2018-02-26 DIAGNOSIS — O409XX0 Polyhydramnios, unspecified trimester, not applicable or unspecified: Secondary | ICD-10-CM | POA: Insufficient documentation

## 2018-02-26 DIAGNOSIS — O99212 Obesity complicating pregnancy, second trimester: Secondary | ICD-10-CM | POA: Insufficient documentation

## 2018-02-26 DIAGNOSIS — Z3480 Encounter for supervision of other normal pregnancy, unspecified trimester: Secondary | ICD-10-CM

## 2018-02-26 DIAGNOSIS — O99210 Obesity complicating pregnancy, unspecified trimester: Secondary | ICD-10-CM

## 2018-02-26 DIAGNOSIS — O9989 Other specified diseases and conditions complicating pregnancy, childbirth and the puerperium: Secondary | ICD-10-CM

## 2018-02-26 DIAGNOSIS — Z3483 Encounter for supervision of other normal pregnancy, third trimester: Secondary | ICD-10-CM

## 2018-02-26 NOTE — Progress Notes (Signed)
Room 2

## 2018-02-26 NOTE — Patient Instructions (Signed)
Polyhydramnios When a woman becomes pregnant, a sac is formed around the fertilized egg (embryo) and later the growing baby (fetus). This sac is called the amniotic sac. The amniotic sac is filled with fluid. It gets bigger as the pregnancy grows. When there is too much fluid in the sac, it is called polyhydramnios. All babies born with polyhydramnios should be checked for congenital abnormalities. The amniotic fluid cushions and protects the baby. It also provides the baby with fluids and is crucial to normal development. Your baby breathes this fluid into its lungs and swallows it. This helps promote the healthy growth of the lungs and gastrointestinal tract. Amniotic fluid also helps the baby move around, helping with the normal development of muscle and bone. What are the causes? This condition is caused by:  Diabetes mellitus.  Down's syndrome, fetal abnormalities of the intestinal tract, and anencephaly (fetus without brain), all of which can prevent the fetus from swallowing the amniotic fluid.  One twins passes (transfuses) its blood into the other twin (twin-twin transfusion syndrome).  Medical illness of the mother, e.g., kidney or heart disease.  Tumor (chorioangioma) of the placenta.  What are the signs or symptoms? Symptoms of this condition include:  Enlarged uterus. The size of the uterus enlarges beyond what it should be for that particular time of the pregnancy.  Pressure and discomfort. The mother may feel more pressure and discomfort than should be expected.  Quick and unexpected enlargement of the mother's stomach.  How is this diagnosed? This condition is diagnosed when your health care provider measures you and notices that your uterus is beyond the size that is consistent with the time of the pregnancy.  An ultrasound is then used (abdominally or vaginally) to: ? See if you are carrying twins or more. ? Measure the growth of the baby. ? Look for birth  defects. ? Measure the amount of fluid in the amniotic sac.  Amniotic Fluid Index (AFI) measures the amount of fluid in the amniotic sac in four different areas. If there is more than 24 centimeters, you have polyhydramnios. How is this treated? This condition may be treated by:  Removing some fluid from the amniotic sac.  Taking medications that lower the fluids in your body.  Stopping the use of salt or salty foods because it causes you to keep fluid in your body (retention).  Follow these instructions at home:  Keep all your prenatal visits as told by your health care provider. It is important to follow your health care provider's recommendations.  Do not eat a lot of salt and salty foods.  If you have diabetes, keep it under control.  If you have heart or kidney disease, treat the disease as told by your health care provider. Contact a health care provider if:  You think your uterus has grown too fast in a short period of time.  You feel a great amount of pressure in your lower belly (pelvis) and are more uncomfortable than expected. Get help right away if:  You have a gush of fluid or are leaking fluid from your vagina.  You stop feeling the baby move.  You do not feel the baby kicking as much as usual.  You have a hard time keeping your diabetes under control.  You are having problems with your heart or kidney disease. This information is not intended to replace advice given to you by your health care provider. Make sure you discuss any questions you have with your health   care provider. Document Released: 11/18/2002 Document Revised: 02/03/2016 Document Reviewed: 03/27/2013 Elsevier Interactive Patient Education  2018 [employer]  

## 2018-02-26 NOTE — Progress Notes (Signed)
   PRENATAL VISIT NOTE  Subjective:  Denise Mclaughlin is a 22 y.o. G2P1001 at 5219w6d being seen today for ongoing prenatal care.  She is currently monitored for the following issues for this high-risk pregnancy and has Acetaminophen overdose; Major depressive disorder, single episode, severe without psychotic features (HCC); Anxiety disorder of adolescence; Bipolar and related disorder (HCC); Obesity, Class III, BMI 40-49.9 (morbid obesity) (HCC); Cannabis abuse, continuous use; Supervision of other normal pregnancy, antepartum; H/O abdominal surgery; Obesity affecting pregnancy, antepartum; Rubella non-immune status, antepartum; Low serum vitamin D; Vitamin D deficiency; and Polyhydramnios on their problem list.  Patient reports no complaints.  Contractions: Irritability. Vag. Bleeding: None.  Movement: Present. Denies leaking of fluid.   The following portions of the patient's history were reviewed and updated as appropriate: allergies, current medications, past family history, past medical history, past social history, past surgical history and problem list. Problem list updated.  Objective:   Vitals:   02/26/18 1347  BP: 113/72  Pulse: (!) 111  Weight: (!) 325 lb (147.4 kg)    Fetal Status: Fetal Heart Rate (bpm): 142   Movement: Present     General:  Alert, oriented and cooperative. Patient is in no acute distress.  Skin: Skin is warm and dry. No rash noted.   Cardiovascular: Normal heart rate noted  Respiratory: Normal respiratory effort, no problems with respiration noted  Abdomen: Soft, gravid, appropriate for gestational age.  Pain/Pressure: Absent     Pelvic: Cervical exam deferred        Extremities: Normal range of motion.  Edema: Trace  Mental Status: Normal mood and affect. Normal behavior. Normal judgment and thought content.   Assessment and Plan:  Pregnancy: G2P1001 at 6919w6d  1. Supervision of other normal pregnancy, antepartum   2. Polyhydramnios in third  trimester complication, single or unspecified fetus Has US f/u today  Preterm labor symptoms and general obstetric precautions including but not limited to vaginal bleeding, contractions, leaking of fluid and fetal movement were reviewed in detail with the patient. Please refer to After Visit Summary for other counseling recommendations.  Return in about 2 weeks (around 03/12/2018).  Future Appointments  Date Time Provider Department Center  02/26/2018  2:45 PM WH-MFC US 2 WH-MFCUS MFC-US  03/05/2018  2:45 PM WH-MFC US 2 WH-M[facility]US MFC-US  03/12/2018  2:00 PM [facility] US 3 WH-M[facility]US M[facility]-US  03/19/2018  3:00 PM WH-MFC US 3 WH-MFCUS M[facility]-US    Scheryl DarterJames Emmary Culbreath, MD

## 2018-03-05 ENCOUNTER — Ambulatory Visit (HOSPITAL_COMMUNITY)
Admission: RE | Admit: 2018-03-05 | Discharge: 2018-03-05 | Disposition: A | Discharge status: Home / Self Care | Payer: Medicaid Other | Source: Ambulatory Visit | Attending: Certified Nurse Midwife | Admitting: Certified Nurse Midwife

## 2018-03-12 ENCOUNTER — Encounter: Payer: Medicaid Other | Admitting: Obstetrics and Gynecology

## 2018-03-12 ENCOUNTER — Ambulatory Visit (HOSPITAL_COMMUNITY): Admission: RE | Admit: 2018-03-12 | Payer: Medicaid Other | Source: Ambulatory Visit

## 2018-03-13 ENCOUNTER — Ambulatory Visit (HOSPITAL_COMMUNITY)
Admission: RE | Admit: 2018-03-13 | Discharge: 2018-03-13 | Disposition: A | Discharge status: Home / Self Care | Payer: Medicaid Other | Source: Ambulatory Visit | Attending: Certified Nurse Midwife | Admitting: Certified Nurse Midwife

## 2018-03-13 ENCOUNTER — Encounter: Payer: Medicaid Other | Admitting: Obstetrics

## 2018-03-13 ENCOUNTER — Encounter (HOSPITAL_COMMUNITY): Payer: Self-pay

## 2018-03-13 DIAGNOSIS — O409XX0 Polyhydramnios, unspecified trimester, not applicable or unspecified: Secondary | ICD-10-CM

## 2018-03-19 ENCOUNTER — Ambulatory Visit (INDEPENDENT_AMBULATORY_CARE_PROVIDER_SITE_OTHER): Payer: Medicaid Other | Admitting: Obstetrics

## 2018-03-19 ENCOUNTER — Other Ambulatory Visit (HOSPITAL_COMMUNITY): Payer: Self-pay | Admitting: Maternal and Fetal Medicine

## 2018-03-19 ENCOUNTER — Encounter (HOSPITAL_COMMUNITY): Payer: Self-pay

## 2018-03-19 ENCOUNTER — Encounter: Payer: Self-pay | Admitting: Obstetrics

## 2018-03-19 ENCOUNTER — Ambulatory Visit (HOSPITAL_COMMUNITY)
Admission: RE | Admit: 2018-03-19 | Discharge: 2018-03-19 | Disposition: A | Discharge status: Home / Self Care | Payer: Medicaid Other | Source: Ambulatory Visit | Attending: Certified Nurse Midwife | Admitting: Certified Nurse Midwife

## 2018-03-19 VITALS — BP 128/79 | HR 110 | Wt 330.6 lb

## 2018-03-19 DIAGNOSIS — O403XX0 Polyhydramnios, third trimester, not applicable or unspecified: Secondary | ICD-10-CM

## 2018-03-19 DIAGNOSIS — O409XX0 Polyhydramnios, unspecified trimester, not applicable or unspecified: Secondary | ICD-10-CM

## 2018-03-19 DIAGNOSIS — O9989 Other specified diseases and conditions complicating pregnancy, childbirth and the puerperium: Secondary | ICD-10-CM

## 2018-03-19 DIAGNOSIS — O99213 Obesity complicating pregnancy, third trimester: Secondary | ICD-10-CM | POA: Insufficient documentation

## 2018-03-19 DIAGNOSIS — E669 Obesity, unspecified: Secondary | ICD-10-CM

## 2018-03-19 DIAGNOSIS — Z3A33 33 weeks gestation of pregnancy: Secondary | ICD-10-CM | POA: Insufficient documentation

## 2018-03-19 DIAGNOSIS — O99210 Obesity complicating pregnancy, unspecified trimester: Secondary | ICD-10-CM

## 2018-03-19 DIAGNOSIS — Z283 Underimmunization status: Secondary | ICD-10-CM

## 2018-03-19 DIAGNOSIS — O0993 Supervision of high risk pregnancy, unspecified, third trimester: Secondary | ICD-10-CM

## 2018-03-19 DIAGNOSIS — F322 Major depressive disorder, single episode, severe without psychotic features: Secondary | ICD-10-CM

## 2018-03-19 DIAGNOSIS — O09899 Supervision of other high risk pregnancies, unspecified trimester: Secondary | ICD-10-CM

## 2018-03-19 DIAGNOSIS — O0990 Supervision of high risk pregnancy, unspecified, unspecified trimester: Secondary | ICD-10-CM

## 2018-03-19 DIAGNOSIS — Z9889 Other specified postprocedural states: Secondary | ICD-10-CM

## 2018-03-19 NOTE — Progress Notes (Signed)
Subjective:  Denise Mclaughlin is a 22 y.o. G2P1001 at 6663w6d being seen today for ongoing prenatal care.  She is currently monitored for the following issues for this high-risk pregnancy and has Acetaminophen overdose; Major depressive disorder, single episode, severe without psychotic features (HCC); Anxiety disorder of adolescence; Bipolar and related disorder (HCC); Obesity, Class III, BMI 40-49.9 (morbid obesity) (HCC); Cannabis abuse, continuous use; Supervision of other normal pregnancy, antepartum; H/O abdominal surgery; Obesity affecting pregnancy, antepartum; Rubella non-immune status, antepartum; Low serum vitamin D; Vitamin D deficiency; and Polyhydramnios on their problem list.  Patient reports no complaints.  Contractions: Irregular. Vag. Bleeding: None.  Movement: Present. Denies leaking of fluid.   The following portions of the patient's history were reviewed and updated as appropriate: allergies, current medications, past family history, past medical history, past social history, past surgical history and problem list. Problem list updated.  Objective:   Vitals:   03/19/18 1427  BP: 128/79  Pulse: (!) 110  Weight: (!) 330 lb 9.6 oz (150 kg)    Fetal Status: Fetal Heart Rate (bpm): 140   Movement: Present     General:  Alert, oriented and cooperative. Patient is in no acute distress.  Skin: Skin is warm and dry. No rash noted.   Cardiovascular: Normal heart rate noted  Respiratory: Normal respiratory effort, no problems with respiration noted  Abdomen: Soft, gravid, appropriate for gestational age. Pain/Pressure: Absent     Pelvic:  Cervical exam deferred        Extremities: Normal range of motion.  Edema: Trace  Mental Status: Normal mood and affect. Normal behavior. Normal judgment and thought content.   Urinalysis:      Assessment and Plan:  Pregnancy: G2P1001 at 7563w6d  1. Supervision of high risk pregnancy, antepartum  2. Polyhydramnios in third trimester  complication, single or unspecified fetus - twice a week BPP's - delivery at 39 weeks  3. Obesity affecting pregnancy, antepartum  4. H/O abdominal surgery  5. Major depressive disorder, single episode, severe without psychotic features (HCC) - stable   Preterm labor symptoms and general obstetric precautions including but not limited to vaginal bleeding, contractions, leaking of fluid and fetal movement were reviewed in detail with the patient. Please refer to After Visit Summary for other counseling recommendations.  Return in about 2 weeks (around 04/02/2018) for ROB.   Brock BadHarper, Merrissa Giacobbe A, MD

## 2018-03-20 ENCOUNTER — Other Ambulatory Visit (HOSPITAL_COMMUNITY): Payer: Self-pay | Admitting: *Deleted

## 2018-03-20 DIAGNOSIS — O403XX0 Polyhydramnios, third trimester, not applicable or unspecified: Secondary | ICD-10-CM

## 2018-03-26 ENCOUNTER — Other Ambulatory Visit (HOSPITAL_COMMUNITY): Payer: Self-pay | Admitting: Obstetrics and Gynecology

## 2018-03-26 ENCOUNTER — Encounter (HOSPITAL_COMMUNITY): Payer: Self-pay

## 2018-03-26 ENCOUNTER — Ambulatory Visit (HOSPITAL_COMMUNITY)
Admission: RE | Admit: 2018-03-26 | Discharge: 2018-03-26 | Disposition: A | Discharge status: Home / Self Care | Payer: Medicaid Other | Source: Ambulatory Visit | Attending: Certified Nurse Midwife | Admitting: Certified Nurse Midwife

## 2018-03-26 DIAGNOSIS — O403XX0 Polyhydramnios, third trimester, not applicable or unspecified: Secondary | ICD-10-CM | POA: Diagnosis not present

## 2018-03-26 DIAGNOSIS — O09899 Supervision of other high risk pregnancies, unspecified trimester: Secondary | ICD-10-CM

## 2018-03-26 DIAGNOSIS — Z3A34 34 weeks gestation of pregnancy: Secondary | ICD-10-CM | POA: Diagnosis not present

## 2018-03-26 DIAGNOSIS — O99213 Obesity complicating pregnancy, third trimester: Secondary | ICD-10-CM

## 2018-03-26 DIAGNOSIS — E669 Obesity, unspecified: Secondary | ICD-10-CM | POA: Diagnosis not present

## 2018-03-26 DIAGNOSIS — O9989 Other specified diseases and conditions complicating pregnancy, childbirth and the puerperium: Secondary | ICD-10-CM

## 2018-03-26 DIAGNOSIS — O359XX0 Maternal care for (suspected) fetal abnormality and damage, unspecified, not applicable or unspecified: Secondary | ICD-10-CM | POA: Diagnosis not present

## 2018-03-26 DIAGNOSIS — Z283 Underimmunization status: Secondary | ICD-10-CM

## 2018-03-26 DIAGNOSIS — O99210 Obesity complicating pregnancy, unspecified trimester: Secondary | ICD-10-CM

## 2018-04-02 ENCOUNTER — Encounter (HOSPITAL_COMMUNITY): Payer: Self-pay

## 2018-04-02 ENCOUNTER — Ambulatory Visit (INDEPENDENT_AMBULATORY_CARE_PROVIDER_SITE_OTHER): Payer: Medicaid Other | Admitting: Obstetrics & Gynecology

## 2018-04-02 ENCOUNTER — Other Ambulatory Visit (HOSPITAL_COMMUNITY): Payer: Self-pay | Admitting: Obstetrics and Gynecology

## 2018-04-02 ENCOUNTER — Ambulatory Visit (HOSPITAL_COMMUNITY)
Admission: RE | Admit: 2018-04-02 | Discharge: 2018-04-02 | Disposition: A | Discharge status: Home / Self Care | Payer: Medicaid Other | Source: Ambulatory Visit | Attending: Certified Nurse Midwife | Admitting: Certified Nurse Midwife

## 2018-04-02 ENCOUNTER — Other Ambulatory Visit (HOSPITAL_COMMUNITY)
Admission: RE | Admit: 2018-04-02 | Discharge: 2018-04-02 | Disposition: A | Discharge status: Home / Self Care | Payer: Medicaid Other | Source: Ambulatory Visit | Attending: Obstetrics & Gynecology | Admitting: Obstetrics & Gynecology

## 2018-04-02 ENCOUNTER — Encounter: Payer: Self-pay | Admitting: Obstetrics & Gynecology

## 2018-04-02 VITALS — BP 133/83 | HR 98 | Wt 336.6 lb

## 2018-04-02 DIAGNOSIS — O99213 Obesity complicating pregnancy, third trimester: Secondary | ICD-10-CM

## 2018-04-02 DIAGNOSIS — O359XX0 Maternal care for (suspected) fetal abnormality and damage, unspecified, not applicable or unspecified: Secondary | ICD-10-CM

## 2018-04-02 DIAGNOSIS — Z3A35 35 weeks gestation of pregnancy: Secondary | ICD-10-CM | POA: Diagnosis not present

## 2018-04-02 DIAGNOSIS — O403XX0 Polyhydramnios, third trimester, not applicable or unspecified: Secondary | ICD-10-CM

## 2018-04-02 DIAGNOSIS — O0993 Supervision of high risk pregnancy, unspecified, third trimester: Secondary | ICD-10-CM | POA: Diagnosis not present

## 2018-04-02 DIAGNOSIS — Z283 Underimmunization status: Secondary | ICD-10-CM

## 2018-04-02 DIAGNOSIS — O9989 Other specified diseases and conditions complicating pregnancy, childbirth and the puerperium: Secondary | ICD-10-CM

## 2018-04-02 DIAGNOSIS — Z2839 Other underimmunization status: Secondary | ICD-10-CM

## 2018-04-02 DIAGNOSIS — O0990 Supervision of high risk pregnancy, unspecified, unspecified trimester: Secondary | ICD-10-CM | POA: Diagnosis present

## 2018-04-02 DIAGNOSIS — O99210 Obesity complicating pregnancy, unspecified trimester: Secondary | ICD-10-CM

## 2018-04-02 LAB — OB RESULTS CONSOLE GC/CHLAMYDIA: Gonorrhea: NEGATIVE

## 2018-04-02 NOTE — Progress Notes (Signed)
   PRENATAL VISIT NOTE  Subjective:  Denise Mclaughlin is a 22 y.o. G2P1001 at 1743w6d being seen today for ongoing prenatal care.  She is currently monitored for the following issues for this high-risk pregnancy and has Acetaminophen overdose; Major depressive disorder, single episode, severe without psychotic features (HCC); Anxiety disorder of adolescence; Bipolar and related disorder (HCC); Obesity, Class III, BMI 40-49.9 (morbid obesity) (HCC); Cannabis abuse, continuous use; Supervision of other normal pregnancy, antepartum; H/O abdominal surgery; Obesity affecting pregnancy, antepartum; Rubella non-immune status, antepartum; Low serum vitamin D; Vitamin D deficiency; and Polyhydramnios on their problem list.  Patient reports pelvic pain and pressure.  Contractions: Irregular. Vag. Bleeding: None.  Movement: Present. Denies leaking of fluid.   The following portions of the patient's history were reviewed and updated as appropriate: allergies, current medications, past family history, past medical history, past social history, past surgical history and problem list. Problem list updated.  Objective:   Vitals:   04/02/18 1455  BP: 133/83  Pulse: 98  Weight: (!) 152.7 kg (336 lb 9.6 oz)    Fetal Status: Fetal Heart Rate (bpm): 152   Movement: Present     General:  Alert, oriented and cooperative. Patient is in no acute distress.  Skin: Skin is warm and dry. No rash noted.   Cardiovascular: Normal heart rate noted  Respiratory: Normal respiratory effort, no problems with respiration noted  Abdomen: Soft, gravid, appropriate for gestational age.  Pain/Pressure: Present     Pelvic: Cervical exam performed        Extremities: Normal range of motion.  Edema: Trace  Mental Status: Normal mood and affect. Normal behavior. Normal judgment and thought content.   Assessment and Plan:  Pregnancy: G2P1001 at 6143w6d  1. Supervision of high risk pregnancy, antepartum Routine 36 weeks - Strep Gp B  NAA - Cervicovaginal ancillary only  2. Polyhydramnios in third trimester complication, single or unspecified fetus Continue MFM f/u  Preterm labor symptoms and general obstetric precautions including but not limited to vaginal bleeding, contractions, leaking of fluid and fetal movement were reviewed in detail with the patient. Please refer to After Visit Summary for other counseling recommendations.  Return in about 1 week (around 04/09/2018).  Future Appointments  Date Time Provider Department Center  04/09/2018  1:45 PM WH-MFC US 2 WH-MFCUS MFC-US  04/16/2018  1:30 PM WH-MFC US 5 WH-MFCUS MFC-US    Scheryl DarterJames Muaad Boehning, MD

## 2018-04-02 NOTE — Patient Instructions (Signed)

## 2018-04-03 LAB — CERVICOVAGINAL ANCILLARY ONLY
Chlamydia: NEGATIVE
NEISSERIA GONORRHEA: NEGATIVE

## 2018-04-04 ENCOUNTER — Inpatient Hospital Stay (HOSPITAL_COMMUNITY)
Admission: AD | Admit: 2018-04-04 | Discharge: 2018-04-06 | DRG: 806 | Disposition: A | Discharge status: Home / Self Care | Payer: Medicaid Other | Attending: Obstetrics and Gynecology | Admitting: Obstetrics and Gynecology

## 2018-04-04 ENCOUNTER — Inpatient Hospital Stay (HOSPITAL_COMMUNITY): Payer: Medicaid Other | Admitting: Anesthesiology

## 2018-04-04 ENCOUNTER — Inpatient Hospital Stay (HOSPITAL_BASED_OUTPATIENT_CLINIC_OR_DEPARTMENT_OTHER): Payer: Medicaid Other

## 2018-04-04 ENCOUNTER — Encounter (HOSPITAL_COMMUNITY): Payer: Self-pay | Admitting: *Deleted

## 2018-04-04 DIAGNOSIS — O403XX0 Polyhydramnios, third trimester, not applicable or unspecified: Secondary | ICD-10-CM

## 2018-04-04 DIAGNOSIS — O288 Other abnormal findings on antenatal screening of mother: Secondary | ICD-10-CM | POA: Diagnosis not present

## 2018-04-04 DIAGNOSIS — O359XX0 Maternal care for (suspected) fetal abnormality and damage, unspecified, not applicable or unspecified: Secondary | ICD-10-CM

## 2018-04-04 DIAGNOSIS — T391X1A Poisoning by 4-Aminophenol derivatives, accidental (unintentional), initial encounter: Secondary | ICD-10-CM | POA: Diagnosis present

## 2018-04-04 DIAGNOSIS — O99213 Obesity complicating pregnancy, third trimester: Secondary | ICD-10-CM

## 2018-04-04 DIAGNOSIS — E559 Vitamin D deficiency, unspecified: Secondary | ICD-10-CM | POA: Diagnosis present

## 2018-04-04 DIAGNOSIS — O99210 Obesity complicating pregnancy, unspecified trimester: Secondary | ICD-10-CM

## 2018-04-04 DIAGNOSIS — F322 Major depressive disorder, single episode, severe without psychotic features: Secondary | ICD-10-CM | POA: Diagnosis present

## 2018-04-04 DIAGNOSIS — O134 Gestational [pregnancy-induced] hypertension without significant proteinuria, complicating childbirth: Secondary | ICD-10-CM | POA: Diagnosis present

## 2018-04-04 DIAGNOSIS — O409XX0 Polyhydramnios, unspecified trimester, not applicable or unspecified: Secondary | ICD-10-CM | POA: Diagnosis present

## 2018-04-04 DIAGNOSIS — F319 Bipolar disorder, unspecified: Secondary | ICD-10-CM | POA: Diagnosis present

## 2018-04-04 DIAGNOSIS — O139 Gestational [pregnancy-induced] hypertension without significant proteinuria, unspecified trimester: Secondary | ICD-10-CM

## 2018-04-04 DIAGNOSIS — Z283 Underimmunization status: Secondary | ICD-10-CM

## 2018-04-04 DIAGNOSIS — Z3A36 36 weeks gestation of pregnancy: Secondary | ICD-10-CM

## 2018-04-04 DIAGNOSIS — O99214 Obesity complicating childbirth: Secondary | ICD-10-CM | POA: Diagnosis present

## 2018-04-04 DIAGNOSIS — F938 Other childhood emotional disorders: Secondary | ICD-10-CM | POA: Diagnosis present

## 2018-04-04 DIAGNOSIS — O99324 Drug use complicating childbirth: Secondary | ICD-10-CM | POA: Diagnosis present

## 2018-04-04 DIAGNOSIS — O09899 Supervision of other high risk pregnancies, unspecified trimester: Secondary | ICD-10-CM

## 2018-04-04 DIAGNOSIS — Z2839 Other underimmunization status: Secondary | ICD-10-CM

## 2018-04-04 DIAGNOSIS — F329 Major depressive disorder, single episode, unspecified: Secondary | ICD-10-CM | POA: Diagnosis present

## 2018-04-04 DIAGNOSIS — O9989 Other specified diseases and conditions complicating pregnancy, childbirth and the puerperium: Secondary | ICD-10-CM

## 2018-04-04 DIAGNOSIS — O99344 Other mental disorders complicating childbirth: Secondary | ICD-10-CM | POA: Diagnosis present

## 2018-04-04 DIAGNOSIS — Z87891 Personal history of nicotine dependence: Secondary | ICD-10-CM

## 2018-04-04 DIAGNOSIS — F1210 Cannabis abuse, uncomplicated: Secondary | ICD-10-CM | POA: Diagnosis present

## 2018-04-04 LAB — URINALYSIS, ROUTINE W REFLEX MICROSCOPIC
BILIRUBIN URINE: NEGATIVE
GLUCOSE, UA: NEGATIVE mg/dL
KETONES UR: NEGATIVE mg/dL
Nitrite: NEGATIVE
PH: 7 (ref 5.0–8.0)
Protein, ur: 30 mg/dL — AB
SPECIFIC GRAVITY, URINE: 1.019 (ref 1.005–1.030)
WBC, UA: >50 WBC/hpf — ABNORMAL HIGH (ref 0–5)

## 2018-04-04 LAB — CBC
HCT: 28.6 % — ABNORMAL LOW (ref 36.0–46.0)
Hemoglobin: 8.5 g/dL — ABNORMAL LOW (ref 12.0–15.0)
MCH: 20 pg — AB (ref 26.0–34.0)
MCHC: 29.7 g/dL — ABNORMAL LOW (ref 30.0–36.0)
MCV: 67.3 fL — ABNORMAL LOW (ref 78.0–100.0)
PLATELETS: 160 10*3/uL (ref 150–400)
RBC: 4.25 MIL/uL (ref 3.87–5.11)
RDW: 18.2 % — ABNORMAL HIGH (ref 11.5–15.5)
WBC: 14.5 10*3/uL — ABNORMAL HIGH (ref 4.0–10.5)

## 2018-04-04 LAB — COMPREHENSIVE METABOLIC PANEL
ALBUMIN: 2.2 g/dL — AB (ref 3.5–5.0)
ALK PHOS: 225 U/L — AB (ref 38–126)
ALT: 13 U/L (ref 0–44)
ANION GAP: 11 (ref 5–15)
AST: 17 U/L (ref 15–41)
BILIRUBIN TOTAL: 0.1 mg/dL — AB (ref 0.3–1.2)
BUN: 11 mg/dL (ref 6–20)
CALCIUM: 8.7 mg/dL — AB (ref 8.9–10.3)
CO2: 21 mmol/L — AB (ref 22–32)
CREATININE: 0.56 mg/dL (ref 0.44–1.00)
Chloride: 105 mmol/L (ref 98–111)
GFR calc non Af Amer: >60 mL/min (ref >60)
GLUCOSE: 137 mg/dL — AB (ref 70–99)
Potassium: 4.4 mmol/L (ref 3.5–5.1)
SODIUM: 137 mmol/L (ref 135–145)
TOTAL PROTEIN: 6 g/dL — AB (ref 6.5–8.1)

## 2018-04-04 LAB — TYPE AND SCREEN
ABO/RH(D): A POS
Antibody Screen: NEGATIVE

## 2018-04-04 LAB — PROTEIN / CREATININE RATIO, URINE
Creatinine, Urine: 154 mg/dL
Protein Creatinine Ratio: 0.24 mg/mg{Cre} — ABNORMAL HIGH (ref 0.00–0.15)
Total Protein, Urine: 37 mg/dL

## 2018-04-04 LAB — STREP GP B NAA: STREP GROUP B AG: NEGATIVE

## 2018-04-04 MED ORDER — PHENYLEPHRINE 40 MCG/ML (10ML) SYRINGE FOR IV PUSH (FOR BLOOD PRESSURE SUPPORT): 80.0000 ug | PREFILLED_SYRINGE | INTRAVENOUS | Status: DC | PRN

## 2018-04-04 MED ORDER — LACTATED RINGERS IV SOLN
500.0000 mL | Freq: Once | INTRAVENOUS | Status: AC
  Administered 2018-04-04: 500 mL via INTRAVENOUS

## 2018-04-04 MED ORDER — OXYCODONE-ACETAMINOPHEN 5-325 MG PO TABS: 2.0000 | ORAL_TABLET | ORAL | Status: DC | PRN

## 2018-04-04 MED ORDER — DIPHENHYDRAMINE HCL 50 MG/ML IJ SOLN: 12.5000 mg | INTRAMUSCULAR | Status: DC | PRN

## 2018-04-04 MED ORDER — EPHEDRINE 5 MG/ML INJ
10.0000 mg | INTRAVENOUS | Status: DC | PRN
  Filled 2018-04-04: qty 2

## 2018-04-04 MED ORDER — FENTANYL CITRATE (PF) 100 MCG/2ML IJ SOLN: 50.0000 ug | INTRAMUSCULAR | Status: DC | PRN

## 2018-04-04 MED ORDER — SOD CITRATE-CITRIC ACID 500-334 MG/5ML PO SOLN
30.0000 mL | ORAL | Status: DC | PRN
Start: 2018-04-04 — End: 2018-04-05

## 2018-04-04 MED ORDER — OXYTOCIN BOLUS FROM INFUSION
500.0000 mL | Freq: Once | INTRAVENOUS | Status: AC
  Administered 2018-04-04: 500 mL via INTRAVENOUS

## 2018-04-04 MED ORDER — LACTATED RINGERS IV SOLN
500.0000 mL | Freq: Once | INTRAVENOUS | Status: DC
Start: 2018-04-04 — End: 2018-04-04

## 2018-04-04 MED ORDER — BETAMETHASONE SOD PHOS & ACET 6 (3-3) MG/ML IJ SUSP
12.0000 mg | Freq: Once | INTRAMUSCULAR | Status: AC
  Administered 2018-04-04: 12 mg via INTRAMUSCULAR
  Filled 2018-04-04: qty 2

## 2018-04-04 MED ORDER — FENTANYL 2.5 MCG/ML BUPIVACAINE 1/10 % EPIDURAL INFUSION (WH - ANES)
14.0000 mL/h | INTRAMUSCULAR | Status: DC | PRN
  Administered 2018-04-04: 14 mL/h via EPIDURAL
  Filled 2018-04-04: qty 100

## 2018-04-04 MED ORDER — LACTATED RINGERS IV SOLN: 500.0000 mL | INTRAVENOUS | Status: DC | PRN

## 2018-04-04 MED ORDER — LIDOCAINE HCL (PF) 1 % IJ SOLN
INTRAMUSCULAR | Status: DC | PRN
  Administered 2018-04-04: 5 mL via EPIDURAL

## 2018-04-04 MED ORDER — LACTATED RINGERS IV SOLN
INTRAVENOUS | Status: DC
  Administered 2018-04-04: 20:00:00 via INTRAVENOUS

## 2018-04-04 MED ORDER — PHENYLEPHRINE 40 MCG/ML (10ML) SYRINGE FOR IV PUSH (FOR BLOOD PRESSURE SUPPORT)
80.0000 ug | PREFILLED_SYRINGE | INTRAVENOUS | Status: DC | PRN
  Filled 2018-04-04: qty 5
  Filled 2018-04-04: qty 10

## 2018-04-04 MED ORDER — OXYTOCIN 40 UNITS IN LACTATED RINGERS INFUSION - SIMPLE MED
2.5000 [IU]/h | INTRAVENOUS | Status: DC
  Filled 2018-04-04: qty 1000

## 2018-04-04 MED ORDER — EPHEDRINE 5 MG/ML INJ: 10.0000 mg | INTRAVENOUS | Status: DC | PRN

## 2018-04-04 MED ORDER — ACETAMINOPHEN 325 MG PO TABS: 650.0000 mg | ORAL_TABLET | ORAL | Status: DC | PRN

## 2018-04-04 MED ORDER — OXYCODONE-ACETAMINOPHEN 5-325 MG PO TABS: 1.0000 | ORAL_TABLET | ORAL | Status: DC | PRN

## 2018-04-04 MED ORDER — PHENYLEPHRINE 40 MCG/ML (10ML) SYRINGE FOR IV PUSH (FOR BLOOD PRESSURE SUPPORT)
80.0000 ug | PREFILLED_SYRINGE | INTRAVENOUS | Status: DC | PRN
  Filled 2018-04-04: qty 5

## 2018-04-04 MED ORDER — FLEET ENEMA 7-19 GM/118ML RE ENEM: 1.0000 | ENEMA | RECTAL | Status: DC | PRN

## 2018-04-04 MED ORDER — ONDANSETRON HCL 4 MG/2ML IJ SOLN: 4.0000 mg | Freq: Four times a day (QID) | INTRAMUSCULAR | Status: DC | PRN

## 2018-04-04 MED ORDER — LIDOCAINE HCL (PF) 1 % IJ SOLN
30.0000 mL | INTRAMUSCULAR | Status: DC | PRN
  Filled 2018-04-04: qty 30

## 2018-04-04 MED ORDER — LACTATED RINGERS AMNIOINFUSION
INTRAVENOUS | Status: DC
  Administered 2018-04-04: 23:00:00 via INTRAUTERINE
  Filled 2018-04-04 (×2): qty 1000

## 2018-04-04 NOTE — Anesthesia Pain Management Evaluation Note (Signed)
  CRNA Pain Management Visit Note  Patient: Denise Mclaughlin, 22 y.o., female  "Hello I am a member of the anesthesia team at [facility]. We have an anesthesia team available at all times to provide care throughout the hospital, including epidural management and anesthesia for C-section. I don't know your plan for the delivery whether it a natural birth, water birth, IV sedation, nitrous supplementation, doula or epidural, but we want to meet your pain goals."   1.Was your pain managed to your expectations on prior hospitalizations?   Yes   2.What is your expectation for pain management during this hospitalization?     Epidural  3.How can we help you reach that goal? unsure  Record the patient's initial score and the patient's pain goal.   Pain: 0  Pain Goal: 10 [facility] wants you to be able to say your pain was always managed very well.  Cephus ShellingBURGER,Denise Mclaughlin 04/04/2018

## 2018-04-04 NOTE — Anesthesia Procedure Notes (Signed)
Epidural Patient location during procedure: OB Start time: 04/04/2018 5:18 PM End time: 04/04/2018 5:35 PM  Staffing Anesthesiologist: Trevor IhaHouser, Ahkeem Goede A, MD Performed: anesthesiologist   Preanesthetic Checklist Completed: patient identified, site marked, surgical consent, pre-op evaluation, timeout performed, IV checked, risks and benefits discussed and monitors and equipment checked  Epidural Patient position: sitting Prep: site prepped and draped and DuraPrep Patient monitoring: continuous pulse ox and blood pressure Approach: midline Location: L3-L4 Injection technique: LOR air  Needle:  Needle type: Tuohy  Needle gauge: 17 G Needle length: 9 cm and 9 Needle insertion depth: 9 cm Catheter type: closed end flexible Catheter size: 19 Gauge Catheter at skin depth: 10 and 16 cm Test dose: negative  Assessment Events: blood not aspirated, injection not painful, no injection resistance, negative IV test and no paresthesia

## 2018-04-04 NOTE — MAU Note (Signed)
Pt stated she has been having abd cramping/ctx since earlier this morning stated they are 2-5 min apart. Noted a pink discharge when she wiped.

## 2018-04-04 NOTE — H&P (Addendum)
OBSTETRIC ADMISSION HISTORY AND PHYSICAL  Denise Mclaughlin is a 22 y.o. female G2P1001 with IUP at [redacted]w[redacted]d Hx of high risk pregnancy, Polyhydramnios, Acetaminophen overdose, Major depression without psychotic features and Hx of Bipolar disorder and anxiety. She is Morbidly obese and  presenting for Painful contractions @0800  and some bloody show (Pink vaginal discharge) When she was in MAU she had NRNST and was given BPP while there that scored 2/8 and was admitted to L&D. FHT has improved while in L&D. She reports +FMs. No LOF, VB, blurry vision, headaches, peripheral edema, or RUQ pain. She plans on Bottlefeeding. She requests Nexplanon for birth control.  Clinic   [facility] Prenatal Labs  Dating   LMP Blood type: A/Positive/-- (02/06 1534)   Genetic Screen NIPS: normal   AFP:neg Antibody:Negative (02/06 1534)  Anatomic [place] 12-19-17; female fetus; increased nucal fold, f/u fetal anatomy; mild poly/LGA Rubella: <0.90 (02/06 1534)non immune  GTT  Third trimester: WNL RPR: Non Reactive (06/04 0949)   Flu vaccine   10/17/2017 HBsAg: Negative (02/06 1534) Neg  TDaP vaccine                                               Rhogam:n/a A+ HIV: Non Reactive (06/04 0949)   Baby Food    breast                                           GBS: (For PCN allergy, check sensitivities)  Contraception   Nexplanon-would like to have in [facility] after delivery Pap:n/a <21 years  Circumcision   Yes   Pediatrician   [facility] CF: NEG  Support Person   FOB-Michael  SMA/Fragile X: Neg  Prenatal Classes no Hgb electrophoresis: Normal     Dating: By LMP --->  Estimated Date of Delivery: 05/01/18  Sono:    @[redacted]w[redacted]d , normal anatomy heels difficult to visualize due to maternal habitus, cephalic presentation, 3149g, >16%XWR, EFW 6 lb 15oz   Prenatal History/Complications: Refer above.  Past Medical History: Past Medical History:  Diagnosis Date  . ADHD (attention deficit hyperactivity disorder)   . Anxiety   .  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 Bipolar and related disorder (HCC) 05/21/2015  . Bipolar disorder (HCC)   . Cannabis abuse, continuous use 05/21/2015  . Conduct disorder 05/21/2015  . Depression   . Obesity 05/21/2015  . Suicidal ideation 05/21/2015  . Urinary tract infection     Past Surgical History: Past Surgical History:  Procedure Laterality Date  . ABDOMINAL SURGERY     ?pyloric stenosis  . MOUTH SURGERY      Obstetrical History: OB History    Gravida  2   Para  1   Term  1   Preterm      AB      Living  1     SAB      TAB      Ectopic      Multiple  0   Live Births  1           Social History: Social History   Socioeconomic History  . Marital status: Single    Spouse name: Not on file  . Number of children: Not on file  . Years of education:  Not on file  . Highest education level: Not on file  Occupational History  . Not on file  Social Needs  . Financial resource strain: Not on file  . Food insecurity:    Worry: Not on file    Inability: Not on file  . Transportation needs:    Medical: Not on file    Non-medical: Not on file  Tobacco Use  . Smoking status: Former Smoker    Types: Cigarettes    Last attempt to quit: 09/12/2016    Years since quitting: 1.5  . Smokeless tobacco: Never Used  Substance and Sexual Activity  . Alcohol use: No  . Drug use: Not Currently    Types: Marijuana    Comment: stopped with 1st pregnancy  . Sexual activity: Yes    Birth control/protection: None  Lifestyle  . Physical activity:    Days per week: Not on file    Minutes per session: Not on file  . Stress: Not on file  Relationships  . Social connections:    Talks on phone: Not on file    Gets together: Not on file    Attends religious service: Not on file    Active member of club or organization: Not on file    Attends meetings of clubs or organizations: Not on file    Relationship status: Not on file  Other Topics Concern  . Not on  file  Social History Narrative   Lives with Dad, & sister. 1 dog at home. Both parents smoke; pt states she used to smoke cigarettes.    Family History: Family History  Problem Relation Age of Onset  . Hypertension Father   . Depression Mother     Allergies: No Known Allergies  Medications Prior to Admission  Medication Sig Dispense Refill Last Dose  . acetaminophen (TYLENOL) 325 MG tablet Take 650 mg by mouth every 6 (six) hours as needed for mild pain or headache.   Past Week at Unknown time  . ferrous sulfate (FERROUSUL) 325 (65 FE) MG tablet Take 1 tablet (325 mg total) by mouth 2 (two) times daily. 60 tablet 1 Past Week at Unknown time  . omeprazole (PRILOSEC) 20 MG capsule Take 1 capsule (20 mg total) by mouth 2 (two) times daily before a meal. 60 capsule 5 04/03/2018 at Unknown time  . ondansetron (ZOFRAN ODT) 8 MG disintegrating tablet Take 1 tablet (8 mg total) by mouth every 8 (eight) hours as needed for nausea or vomiting. 20 tablet 0 Past Month at Unknown time  . Prenat-FeAsp-Meth-FA-DHA w/o A (PRENATE PIXIE) 10-0.6-0.4-200 MG CAPS Take 1 tablet by mouth daily. 30 capsule 12 04/04/2018 at Unknown time  . Vitamin D, Ergocalciferol, (DRISDOL) 50000 units CAPS capsule Take 1 capsule (50,000 Units total) by mouth every 7 (seven) days. (Patient not taking: Reported on 04/04/2018) 30 capsule 2 Not Taking at Unknown time     Review of Systems   All systems reviewed and negative except as stated in HPI  Blood pressure (!) 146/66, pulse (!) 103, temperature 97.9 F (36.6 C), temperature source Oral, resp. rate 20, height 5\' 6"  (1.676 m), weight (!) 153.3 kg (338 lb), last menstrual period 07/25/2017, unknown if currently breastfeeding. General appearance: alert, cooperative, moderate distress and mildly obese Lungs: regular rate and effort Heart: regular rate  Abdomen: soft, non-tender gravid abdomen, pendulous panus  Extremities:, no sign of DVT, Some pain on palpation  bilaterally with no erythema or increase in volume and no pitting edema +  pedile pulses BL. Presentation: cephalic Fetal monitoringBaseline: 150 bpm, Variability: Good {> 6 bpm), Accelerations: Reactive and Decelerations: Variable: mild Uterine activityDate/time of onset: 04/04/18 @0800  Irregular  Dilation: 6 Effacement (%): 70 Station: -2 Exam by:: Dorathy Kinsman CNM   Prenatal labs: ABO, Rh: A/Positive/-- (02/06 1534) Antibody: Negative (02/06 1534) Rubella: <0.90 (02/06 1534) RPR: Non Reactive (06/04 0949)  HBsAg: Negative (02/06 1534)  HIV: Non Reactive (06/04 0949)  GBS: Negative (07/23 1531)  2 hr GTT Negative   Prenatal Transfer Tool  Maternal Diabetes: No Genetic Screening: Abnormal:  Results: Other:Increased NT. Nml NIPS Maternal Ultrasounds/Referrals: Abnormal:  Findings:   Other:Poly Fetal Ultrasounds or other Referrals:  Referred to Materal Fetal Medicine  Maternal Substance Abuse:  No Significant Maternal Medications:  None Significant Maternal Lab Results: Lab values include: Other: BPP 4/10  Results for orders placed or performed during the hospital encounter of 04/04/18 (from the past 24 hour(s))  Urinalysis, Routine w reflex microscopic   Collection Time: 04/04/18 12:31 PM  Result Value Ref Range   Color, Urine YELLOW YELLOW   APPearance HAZY (A) CLEAR   Specific Gravity, Urine 1.019 1.005 - 1.030   pH 7.0 5.0 - 8.0   Glucose, UA NEGATIVE NEGATIVE mg/dL   Hgb urine dipstick LARGE (A) NEGATIVE   Bilirubin Urine NEGATIVE NEGATIVE   Ketones, ur NEGATIVE NEGATIVE mg/dL   Protein, ur 30 (A) NEGATIVE mg/dL   Nitrite NEGATIVE NEGATIVE   Leukocytes, UA MODERATE (A) NEGATIVE   RBC / HPF >50 (H) 0 - 5 RBC/hpf   WBC, UA >50 (H) 0 - 5 WBC/hpf   Bacteria, UA RARE (A) NONE SEEN   Squamous Epithelial / LPF 11-20 0 - 5   Mucus PRESENT   CBC   Collection Time: 04/04/18  4:10 PM  Result Value Ref Range   WBC 14.5 (H) 4.0 - 10.5 K/uL   RBC 4.25 3.87 - 5.11 MIL/uL    Hemoglobin 8.5 (L) 12.0 - 15.0 g/dL   HCT 16.1 (L) 09.6 - 04.5 %   MCV 67.3 (L) 78.0 - 100.0 fL   MCH 20.0 (L) 26.0 - 34.0 pg   MCHC 29.7 (L) 30.0 - 36.0 g/dL   RDW 40.9 (H) 81.1 - 91.4 %   Platelets 160 150 - 400 K/uL    Patient Active Problem List   Diagnosis Date Noted  . Labor and delivery indication for care or intervention 04/04/2018  . Polyhydramnios 02/26/2018  . Vitamin D deficiency 10/26/2017  . Obesity affecting pregnancy, antepartum 10/24/2017  . Rubella non-immune status, antepartum 10/24/2017  . Low serum vitamin D 10/24/2017  . Supervision of other normal pregnancy, antepartum 10/17/2017  . H/O abdominal surgery 10/17/2017  . 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 Bipolar and related disorder (HCC) 05/21/2015  . Obesity, Class III, BMI 40-49.9 (morbid obesity) (HCC) 05/21/2015  . Cannabis abuse, continuous use 05/21/2015  . Major depressive disorder, single episode, severe without psychotic features (HCC)   . Acetaminophen overdose 05/18/2015    Assessment: Jennea Rager is a 22 y.o. G2P1001 at 109w1d here for NRFHT and BPP of 2/8 in MAU so was admitted for observation of fetal wellbeing and possible emergent delivery. (FHT has improved in on L&D)   1. Labor: Active early  2. FWB: Cat 2 3. Pain: Moderate 4. GBS: Negative    Plan: High risk pregnancy  Patient is now in active labour and expected to progress and deliver vaginally if FWB is maintained Pain management as tolerated by patient  CBC, RPR and Type and Cross Manage expectantly for NSVD  Monitor fetal maternal wellbeing.  Sandi RavelingAnson B Alvis, MD  04/04/2018, 4:59 PM   I was present for the exam and agree with above. Consulted Dr. Alysia PennaErvin, attending and Dr. Reece LevyShenkar, MFM RE: what is now 4/10 BPP. Recommend delivery. May try for vaginal delivery w/ improvement of FHR tracing, but avoid cytotec. Dr. Alysia PennaErvin discussed POC w/ Pt. [facility] CTO FHR tracing closely. C/S PRN.   Katrinka BlazingSmith,  [place], [place] 04/04/2018 8:56 PM

## 2018-04-04 NOTE — Progress Notes (Signed)
RN attempted to remove cytotec for vaginal placement at 1840. Cytotec grayed out in Mariano ColanPyxis. Called pharmacy, will restock.  Eliga Arvie [place] Darthy Manganelli, [place] 04/04/2018 6:51 PM

## 2018-04-04 NOTE — Progress Notes (Signed)
Repetitive variable decels w/ctx.  IUPC placed and amnioinfusion started.  Variables resolved and + frequent accels.  MVUs 150 but pt making progress so will not augment

## 2018-04-04 NOTE — Progress Notes (Signed)
Patient ID: Denise Mclaughlin, female   DOB: 09/29/1995, 22 y.o.   MRN: [id number] Denise Mclaughlin is a 22 y.o. G2P1001 at 3613w1d.  Subjective: Much uncomfortable w/ UC's  Objective: BP (!) 146/66 (BP Location: Right Arm)   Pulse (!) 103   Temp 97.9 F (36.6 C) (Oral)   Resp 20   Ht 5\' 6"  (1.676 m)   Wt (!) 338 lb (153.3 kg)   LMP 07/25/2017 (Approximate)   BMI 54.55 kg/m    FHT:  FHR: 135 bpm, variability: mod,  accelerations:  15x15,  decelerations:  none UC:   Q 1-5 minutes, mod Dilation: 6 Effacement (%): 70 Cervical Position: Middle Station: -2 Presentation: Vertex Exam by:: Dorathy KinsmanVirginia Olympia Adelsberger CNM  Labs: Results for orders placed or performed during the hospital encounter of 04/04/18 (from the past 24 hour(s))  Urinalysis, Routine w reflex microscopic     Status: Abnormal   Collection Time: 04/04/18 12:31 PM  Result Value Ref Range   Color, Urine YELLOW YELLOW   APPearance HAZY (A) CLEAR   Specific Gravity, Urine 1.019 1.005 - 1.030   pH 7.0 5.0 - 8.0   Glucose, UA NEGATIVE NEGATIVE mg/dL   Hgb urine dipstick LARGE (A) NEGATIVE   Bilirubin Urine NEGATIVE NEGATIVE   Ketones, ur NEGATIVE NEGATIVE mg/dL   Protein, ur 30 (A) NEGATIVE mg/dL   Nitrite NEGATIVE NEGATIVE   Leukocytes, UA MODERATE (A) NEGATIVE   RBC / HPF >50 (H) 0 - 5 RBC/hpf   WBC, UA >50 (H) 0 - 5 WBC/hpf   Bacteria, UA RARE (A) NONE SEEN   Squamous Epithelial / LPF 11-20 0 - 5   Mucus PRESENT     Assessment / Plan: 6513w1d week IUP Labor: Active Fetal Wellbeing:  Category I Pain Control:  Requesting epidural Anticipated MOD:  SVD Watch fetal status closely 2/2 BPP 4/10. Reactive tracing now.   Katrinka BlazingSmith, [place], CNM 04/04/2018 4:40 PM

## 2018-04-04 NOTE — Anesthesia Preprocedure Evaluation (Signed)
Anesthesia Evaluation  Patient identified by MRN, date of birth, ID band Patient awake    Reviewed: Allergy & Precautions, NPO status , Patient's Chart, lab work & pertinent test results  Airway Mallampati: III  TM Distance: >3 FB Neck ROM: Full    Dental no notable dental hx. (+) Teeth Intact, Dental Advisory Given   Pulmonary neg pulmonary ROS, former smoker,    Pulmonary exam normal breath sounds clear to auscultation       Cardiovascular negative cardio ROS Normal cardiovascular exam Rhythm:Regular Rate:Normal     Neuro/Psych negative neurological ROS  negative psych ROS   GI/Hepatic   Endo/Other  Morbid obesity  Renal/GU      Musculoskeletal   Abdominal (+) + obese,   Peds  Hematology  (+) anemia ,   Anesthesia Other Findings   Reproductive/Obstetrics (+) Pregnancy                             Lab Results  Component Value Date   WBC 14.5 (H) 04/04/2018   HGB 8.5 (L) 04/04/2018   HCT 28.6 (L) 04/04/2018   MCV 67.3 (L) 04/04/2018   PLT 160 04/04/2018    Anesthesia Physical Anesthesia Plan  ASA: IV  Anesthesia Plan: Epidural   Post-op Pain Management:    Induction:   PONV Risk Score and Plan:   Airway Management Planned:   Additional Equipment:   Intra-op Plan:   Post-operative Plan:   Informed Consent: I have reviewed the patients History and Physical, chart, labs and discussed the procedure including the risks, benefits and alternatives for the proposed anesthesia with the patient or authorized representative who has indicated his/her understanding and acceptance.     Plan Discussed with:   Anesthesia Plan Comments:         Anesthesia Quick Evaluation

## 2018-04-04 NOTE — Progress Notes (Signed)
AROM (small hole) w/clear fluid 1941.   Patient Vitals for the past 4 hrs:  BP Pulse  04/04/18 2031 (!) 150/43 (!) 106  04/04/18 2001 (!) 141/43 93  04/04/18 1931 (!) 142/77 99  04/04/18 1900 123/65 96  04/04/18 1830 (!) 143/78 99  04/04/18 1805 (!) 158/74 100  04/04/18 1800 (!) 142/77 (!) 108  04/04/18 1750 (!) 147/60 95  04/04/18 1745 (!) 143/77 97  04/04/18 1739 (!) 158/78 (!) 107  04/04/18 1734 (!) 136/108 (!) 109  04/04/18 1729 (!) 182/93 (!) 119   Will check PreE labs.    Some variable decels, + scalp stim, responded to position change.  cx 8-9/90/-2.  Ctx not picking up well but q 2-3 mintues. FHR now Cat 1.

## 2018-04-05 ENCOUNTER — Encounter (HOSPITAL_COMMUNITY): Payer: Self-pay

## 2018-04-05 DIAGNOSIS — Z3A36 36 weeks gestation of pregnancy: Secondary | ICD-10-CM

## 2018-04-05 DIAGNOSIS — O139 Gestational [pregnancy-induced] hypertension without significant proteinuria, unspecified trimester: Secondary | ICD-10-CM

## 2018-04-05 LAB — RPR: RPR Ser Ql: NONREACTIVE

## 2018-04-05 MED ORDER — PRENATAL MULTIVITAMIN CH
1.0000 | ORAL_TABLET | Freq: Every day | ORAL | Status: DC
  Administered 2018-04-05 – 2018-04-06 (×2): 1 via ORAL
  Filled 2018-04-05 (×2): qty 1

## 2018-04-05 MED ORDER — TETANUS-DIPHTH-ACELL PERTUSSIS 5-2.5-18.5 LF-MCG/0.5 IM SUSP: 0.5000 mL | Freq: Once | INTRAMUSCULAR | Status: DC

## 2018-04-05 MED ORDER — SIMETHICONE 80 MG PO CHEW: 80.0000 mg | CHEWABLE_TABLET | ORAL | Status: DC | PRN

## 2018-04-05 MED ORDER — MEASLES, MUMPS & RUBELLA VAC ~~LOC~~ INJ
0.5000 mL | INJECTION | Freq: Once | SUBCUTANEOUS | Status: DC
  Filled 2018-04-05: qty 0.5

## 2018-04-05 MED ORDER — ONDANSETRON HCL 4 MG PO TABS
4.0000 mg | ORAL_TABLET | ORAL | Status: DC | PRN
  Administered 2018-04-05: 4 mg via ORAL
  Filled 2018-04-05: qty 1

## 2018-04-05 MED ORDER — IBUPROFEN 600 MG PO TABS
600.0000 mg | ORAL_TABLET | Freq: Four times a day (QID) | ORAL | Status: DC
  Administered 2018-04-05 – 2018-04-06 (×5): 600 mg via ORAL
  Filled 2018-04-05 (×5): qty 1

## 2018-04-05 MED ORDER — BENZOCAINE-MENTHOL 20-0.5 % EX AERO
1.0000 "application " | INHALATION_SPRAY | CUTANEOUS | Status: DC | PRN
  Administered 2018-04-05: 1 via TOPICAL
  Filled 2018-04-05 (×2): qty 56

## 2018-04-05 MED ORDER — BISACODYL 10 MG RE SUPP: 10.0000 mg | Freq: Every day | RECTAL | Status: DC | PRN

## 2018-04-05 MED ORDER — DOCUSATE SODIUM 100 MG PO CAPS
100.0000 mg | ORAL_CAPSULE | Freq: Two times a day (BID) | ORAL | Status: DC
  Administered 2018-04-05 – 2018-04-06 (×2): 100 mg via ORAL
  Filled 2018-04-05 (×2): qty 1

## 2018-04-05 MED ORDER — ACETAMINOPHEN 325 MG PO TABS
650.0000 mg | ORAL_TABLET | Freq: Four times a day (QID) | ORAL | Status: DC | PRN
  Administered 2018-04-05 – 2018-04-06 (×2): 650 mg via ORAL
  Filled 2018-04-05 (×2): qty 2

## 2018-04-05 MED ORDER — FLEET ENEMA 7-19 GM/118ML RE ENEM: 1.0000 | ENEMA | Freq: Every day | RECTAL | Status: DC | PRN

## 2018-04-05 MED ORDER — FERROUS SULFATE 325 (65 FE) MG PO TABS
325.0000 mg | ORAL_TABLET | Freq: Two times a day (BID) | ORAL | Status: DC
  Administered 2018-04-05 – 2018-04-06 (×3): 325 mg via ORAL
  Filled 2018-04-05 (×3): qty 1

## 2018-04-05 MED ORDER — METHYLERGONOVINE MALEATE 0.2 MG PO TABS: 0.2000 mg | ORAL_TABLET | ORAL | Status: DC | PRN

## 2018-04-05 MED ORDER — METHYLERGONOVINE MALEATE 0.2 MG/ML IJ SOLN: 0.2000 mg | INTRAMUSCULAR | Status: DC | PRN

## 2018-04-05 MED ORDER — DIBUCAINE 1 % RE OINT: 1.0000 "application " | TOPICAL_OINTMENT | RECTAL | Status: DC | PRN

## 2018-04-05 MED ORDER — DIPHENHYDRAMINE HCL 25 MG PO CAPS: 25.0000 mg | ORAL_CAPSULE | Freq: Four times a day (QID) | ORAL | Status: DC | PRN

## 2018-04-05 MED ORDER — ONDANSETRON HCL 4 MG/2ML IJ SOLN: 4.0000 mg | INTRAMUSCULAR | Status: DC | PRN

## 2018-04-05 MED ORDER — ZOLPIDEM TARTRATE 5 MG PO TABS: 5.0000 mg | ORAL_TABLET | Freq: Every evening | ORAL | Status: DC | PRN

## 2018-04-05 MED ORDER — WITCH HAZEL-GLYCERIN EX PADS: 1.0000 "application " | MEDICATED_PAD | CUTANEOUS | Status: DC | PRN

## 2018-04-05 MED ORDER — COCONUT OIL OIL: 1.0000 "application " | TOPICAL_OIL | Status: DC | PRN

## 2018-04-05 NOTE — Consult Note (Signed)
[facility]  Delivery Note:  SVD    04/05/2018  12:23 AM  I was called to the delivery room at the request of the patient's obstetrician (Frances Cresenzo-Dishmon) for fetal distress during and after labor.  PRENATAL HX:  This is a 22 y/o G2P1001 at 36 and 2/[redacted] weeks gestation who was admitted for non-reassuring fetal heart tones and active labor.  Her pregnancy has been complicated by morbid obesity, Polyhydramnios, Acetaminophen overdose, Major depression, Bipolar disorder and anxiety.    DELIVERY:  Infant was vigorous at delivery, requiring no resuscitation other than standard warming, drying and stimulation.  However, NICU team called back to delivery room at 20 minutes of age for grunting.  Infant was taken to the warmer, O2 saturations were 99 to 100% in room air.  Infant was bulb suctioned and observed for several minutes.  Infant jittery on exam with occasional comfortable tachypnea, otherwise exam within normal limits.  At this time, it appears that infant is safe to remain with the mother.  Follow-up standard glucose check for late preterm infant protocol.  If there any other concerns, we will come reassess the infant.  _____________________ Electronically Signed By: Loucile Posner, MD Neonatologist  

## 2018-04-05 NOTE — Clinical Social Work Maternal (Signed)
CLINICAL SOCIAL WORK MATERNAL/CHILD NOTE  Patient Details  Name: Denise Mclaughlin MRN: [id number] Date of Birth: 07/04/1996  Date:  15-Oct-2017  Clinical Social Worker Initiating Note:  Laurey Arrow Date/Time: Initiated:  04/05/18/1138     Child's Name:  Denise Mclaughlin   Biological Parents:  Mother, Father(FOB is Darrow Barreiro Sr. 06/23/1999)   Need for Interpreter:  None   Reason for Referral:  Behavioral Health Concerns, Current Substance Use/Substance Use During Pregnancy    Address:  [place] [address]    Phone number:  [phone] (home)     Additional phone number:  Household Members/Support Persons (HM/SP):   Household Member/Support Person 1   HM/SP Name Relationship DOB or Age  HM/SP -[employer]. son 03/11/1999  HM/SP -2        HM/SP -3        HM/SP -4        HM/SP -5        HM/SP -6        HM/SP -7        HM/SP -8          Natural Supports (not living in the home):  Parent, Spouse/significant other, Friends(Per MOB, FOB's family will also provide support. )   Professional Supports: None   Employment: Unemployed   Type of Work:     Education:  9 to 11 years   Homebound arranged: No  Financial Resources:  [employer]   Other Resources:  Physicist, medical , [place] Considerations Which May Impact Care:  Per [employer] Sheet, MOB is [employer].  Strengths:  Ability to meet basic needs , Home prepared for child , Pediatrician chosen, Understanding of illness   Psychotropic Medications:         Pediatrician:    [facility]  Pediatrician List:   [facility] [place]      Pediatrician Fax Number:    Risk Factors/Current Problems:  Mental Health Concerns    Cognitive State:  Alert , Able to Concentrate , Insightful , Linear Thinking    Mood/Affect:  Interested , Happy , Bright ,  Calm , Comfortable    CSW Assessment: CSW met with MOB in room 107 to complete an assessment for MH hx and SA hx. When CSW arrived, MOB was resting in bed bonding with infant.  MOB appeared comfortable and expressed happiness about being a new mom again. MOB was polite and receptive to meeting with CSW.   CSW asked about MOB SA hx and MOB denied the use of all illicit substance since 2017.  MOB stated that MOB smoked marijuana until pregnancy confirmation with MOB's 1st child. CSW explained hospital's policy and MOB was understanding. MOB is aware that infant's UDS was negative that CSW will continue to monitor infant CDS.  CSW explained that if CDS is positive, CSW will make a report to [facility] CPS.   CSW also asked about MOB's MH hx.  MOB shared that infant was dx with bipolar around 8 and has experimented with various medications until around age 22. MOB reported that MOB discontinued medications without MD recommendations. MOB shared that MOB has been managing her symptoms by expressing her thoughts and feelings to her support and  listening to music.    CSW provided education regarding the baby blues period vs. perinatal  mood disorders, discussed treatment and gave resources for mental health follow up if concerns arise.  CSW recommends self-evaluation during the postpartum time period using the New Mom Checklist from Postpartum Progress and encouraged MOB to contact a medical professional if symptoms are noted at any time.  MOB acknowledged experiencing the "Baby Blues,"  But denied additional PPD signs and symptoms.  MOB presented with insight and awareness and did not display any acute symptoms during the assessment.  CSW assessed for safety and MOB denied SI and HI.   CSW provided review of Sudden Infant Death Syndrome (SIDS) precautions.    MOB reports having all essentials for infant and feeling prepared to parent.   CSW Plan/Description:  No Further Intervention Required/No  Barriers to Discharge, Sudden Infant Death Syndrome (SIDS) Education, Perinatal Mood and Anxiety Disorder (PMADs) Education, CSW Will Continue to Monitor Umbilical Cord Tissue Drug Screen Results and Make Report if Warranted, Hospital Drug Screen Policy Information   Amil Bouwman Boyd-Gilyard, MSW, LCSW Clinical Social Work [phone]  Tristin Gladman D BOYD-GILYARD, LCSW 04/05/2018, 11:49 AM 

## 2018-04-05 NOTE — Progress Notes (Signed)
Post Partum Day 1 Subjective: no complaints, up ad lib, voiding, tolerating PO and + flatus She has no calf tenderness, no headache, no dizziness, visual changes or RUQ pain.   Objective: Blood pressure (!) 109/57, pulse 85, temperature 98.3 F (36.8 C), temperature source Oral, resp. rate 18, height 5\' 6"  (1.676 m), weight (!) 153.3 kg (338 lb), last menstrual period 07/25/2017, SpO2 94 %, unknown if currently breastfeeding.  Physical Exam:  General: alert and cooperative Lochia: appropriate Uterine Fundus: firm Incision: none DVT Evaluation: No evidence of DVT seen on physical exam.  Recent Labs    04/04/18 1610  HGB 8.5*  HCT 28.6*    Assessment/Plan: Plan for discharge tomorrow   LOS: 1 day   Sandi Ravelingnson B Wray Goehring 04/05/2018, 7:29 AM

## 2018-04-05 NOTE — Anesthesia Postprocedure Evaluation (Signed)
Anesthesia Post Note  Patient: Denise Mclaughlin  Procedure(s) Performed: AN AD HOC LABOR EPIDURAL     Patient location during evaluation: Mother Baby Anesthesia Type: Epidural Level of consciousness: awake and alert and oriented Pain management: satisfactory to patient Vital Signs Assessment: post-procedure vital signs reviewed and stable Respiratory status: respiratory function stable Cardiovascular status: stable Postop Assessment: no headache, no backache, epidural receding, patient able to bend at knees, no signs of nausea or vomiting and adequate PO intake Anesthetic complications: no    Last Vitals:  Vitals:   04/05/18 0245 04/05/18 0600  BP: (!) 115/54 (!) 109/57  Pulse: (!) 106 85  Resp: 18 18  Temp: 37.3 C 36.8 C  SpO2: 95% 94%    Last Pain:  Vitals:   04/05/18 0600  TempSrc: Oral  PainSc: 6    Pain Goal: Patients Stated Pain Goal: 0 (04/04/18 1203)               Anise Harbin

## 2018-04-06 MED ORDER — FERROUS SULFATE 325 (65 FE) MG PO TABS: 325.0000 mg | ORAL_TABLET | Freq: Two times a day (BID) | ORAL | 3 refills | Status: AC

## 2018-04-06 MED ORDER — IBUPROFEN 600 MG PO TABS: 600.0000 mg | ORAL_TABLET | Freq: Four times a day (QID) | ORAL | 0 refills | Status: AC

## 2018-04-06 NOTE — Discharge Instructions (Signed)
Postpartum Care After Vaginal Delivery °The period of time right after you deliver your newborn is called the postpartum period. °What kind of medical care will I receive? °· You may continue to receive fluids and medicines through an IV tube inserted into one of your veins. °· If an incision was made near your vagina (episiotomy) or if you had some vaginal tearing during delivery, cold compresses may be placed on your episiotomy or your tear. This helps to reduce pain and swelling. °· You may be given a squirt bottle to use when you go to the bathroom. You may use this until you are comfortable wiping as usual. To use the squirt bottle, follow these steps: °? Before you urinate, fill the squirt bottle with warm water. Do not use hot water. °? After you urinate, while you are sitting on the toilet, use the squirt bottle to rinse the area around your urethra and vaginal opening. This rinses away any urine and blood. °? You may do this instead of wiping. As you start healing, you may use the squirt bottle before wiping yourself. Make sure to wipe gently. °? Fill the squirt bottle with clean water every time you use the bathroom. °· You will be given sanitary pads to wear. °How can I expect to feel? °· You may not feel the need to urinate for several hours after delivery. °· You will have some soreness and pain in your abdomen and vagina. °· If you are breastfeeding, you may have uterine contractions every time you breastfeed for up to several weeks postpartum. Uterine contractions help your uterus return to its normal size. °· It is normal to have vaginal bleeding (lochia) after delivery. The amount and appearance of lochia is often similar to a menstrual period in the first week after delivery. It will gradually decrease over the next few weeks to a dry, yellow-brown discharge. For most women, lochia stops completely by 6-8 weeks after delivery. Vaginal bleeding can vary from woman to woman. °· Within the first few  days after delivery, you may have breast engorgement. This is when your breasts feel heavy, full, and uncomfortable. Your breasts may also throb and feel hard, tightly stretched, warm, and tender. After this occurs, you may have milk leaking from your breasts. Your health care provider can help you relieve discomfort due to breast engorgement. Breast engorgement should go away within a few days. °· You may feel more sad or worried than normal due to hormonal changes after delivery. These feelings should not last more than a few days. If these feelings do not go away after several days, speak with your health care provider. °How should I care for myself? °· Tell your health care provider if you have pain or discomfort. °· Drink enough water to keep your urine clear or pale yellow. °· Wash your hands thoroughly with soap and water for at least 20 seconds after changing your sanitary pads, after using the toilet, and before holding or feeding your baby. °· If you are not breastfeeding, avoid touching your breasts a lot. Doing this can make your breasts produce more milk. °· If you become weak or lightheaded, or you feel like you might faint, ask for help before: °? Getting out of bed. °? Showering. °· Change your sanitary pads frequently. Watch for any changes in your flow, such as a sudden increase in volume, a change in color, the passing of large blood clots. If you pass a blood clot from your vagina, save it   to show to your health care provider. Do not flush blood clots down the toilet without having your health care provider look at them. °· Make sure that all your vaccinations are up to date. This can help protect you and your baby from getting certain diseases. You may need to have immunizations done before you leave the hospital. °· If desired, talk with your health care provider about methods of family planning or birth control (contraception). °How can I start bonding with my baby? °Spending as much time as  possible with your baby is very important. During this time, you and your baby can get to know each other and develop a bond. Having your baby stay with you in your room (rooming in) can give you time to get to know your baby. Rooming in can also help you become comfortable caring for your baby. Breastfeeding can also help you bond with your baby. °How can I plan for returning home with my baby? °· Make sure that you have a car seat installed in your vehicle. °? Your car seat should be checked by a certified car seat installer to make sure that it is installed safely. °? Make sure that your baby fits into the car seat safely. °· Ask your health care provider any questions you have about caring for yourself or your baby. Make sure that you are able to contact your health care provider with any questions after leaving the hospital. °This information is not intended to replace advice given to you by your health care provider. Make sure you discuss any questions you have with your health care provider. °Document Released: 06/25/2007 Document Revised: 01/31/2016 Document Reviewed: 08/02/2015 °Elsevier Interactive Patient Education © 2018 Elsevier Inc. ° °

## 2018-04-06 NOTE — Discharge Summary (Addendum)
OB Discharge Summary     Patient Name: Denise Mclaughlin DOB: 04/25/1996 MRN: [id number]  Date of admission: 04/04/2018 Delivering MD: Denise Mclaughlin   Date of discharge: 04/06/2018  Admitting diagnosis: 36WKS,HURTING Intrauterine pregnancy: 3041w1d     Secondary diagnosis:  Active Problems:   Acetaminophen overdose   Major depressive disorder, single episode, severe without psychotic features (HCC)   Anxiety disorder of adolescence   Bipolar and related disorder (HCC)   Obesity, Class III, BMI 40-49.9 (morbid obesity) (HCC)   Cannabis abuse, continuous use   Obesity affecting pregnancy, antepartum   Rubella non-immune status, antepartum   Vitamin D deficiency   Polyhydramnios   Labor and delivery indication for care or intervention   Gestational hypertension  Additional problems: none     Discharge diagnosis: Term Pregnancy Delivered and Gestational Hypertension                                                                                                Post partum procedures:none  Augmentation: AROM  Complications: None  Hospital course:  Onset of Labor With Vaginal Delivery     22 y.o. yo [id number] at 1341w1d was admitted in Active Labor on 04/04/2018. Patient had an uncomplicated labor course as follows:  Membrane Rupture Time/Date: 7:41 PM ,04/04/2018   Intrapartum Procedures: Episiotomy: None [1]                                         Lacerations:  1st degree [2];Perineal [11]  Patient had a delivery of a Viable infant. 04/04/2018  Information for the patient's newborn:  Denise Mclaughlin [[id number]][[id number]]  Delivery Method: Vaginal, Spontaneous(Filed from Delivery Summary)    Pateint had an uncomplicated postpartum course.  She is ambulating, tolerating a regular diet, passing flatus, and urinating well. Patient is discharged home in stable condition on 04/06/18.   Physical exam  Vitals:   04/05/18 1130 04/05/18 1445 04/05/18 2312 04/06/18 0512  BP: 115/80  131/75 (!) 90/53 (!) 122/57  Pulse:  (!) 109 92 83  Resp:  18  18  Temp:  98.1 F (36.7 C) 97.9 F (36.6 C) 97.9 F (36.6 C)  TempSrc:  Axillary Oral Oral  SpO2:  98%  100%  Weight:      Height:       General: alert, cooperative and no distress Lochia: appropriate Uterine Fundus: firm Incision: N/A DVT Evaluation: No evidence of DVT seen on physical exam. Labs: Lab Results  Component Value Date   WBC 14.5 (H) 04/04/2018   HGB 8.5 (L) 04/04/2018   HCT 28.6 (L) 04/04/2018   MCV 67.3 (L) 04/04/2018   PLT 160 04/04/2018   CMP Latest Ref Rng & Units 04/04/2018  Glucose 70 - 99 mg/dL 130(Q137(H)  BUN 6 - 20 mg/dL 11  Creatinine 6.570.44 - 8.461.00 mg/dL 9.620.56  Sodium 952135 - 841145 mmol/L 137  Potassium 3.5 - 5.1 mmol/L 4.4  Chloride 98 - 111 mmol/L 105  CO2 22 - 32 mmol/L 21(L)  Calcium  8.9 - 10.3 mg/dL 1.3(Y)  Total Protein 6.5 - 8.1 g/dL 6.0(L)  Total Bilirubin 0.3 - 1.2 mg/dL 8.6(V)  Alkaline Phos 38 - 126 U/L 225(H)  AST 15 - 41 U/L 17  ALT 0 - 44 U/L 13    Discharge instruction: per After Visit Summary and "Baby and Me Booklet".  After visit meds:  Allergies as of 04/06/2018   No Known Allergies     Medication List    TAKE these medications   acetaminophen 325 MG tablet Commonly known as:  TYLENOL Take 650 mg by mouth every 6 (six) hours as needed for mild pain or headache.   ferrous sulfate 325 (65 FE) MG tablet Commonly known as:  FERROUSUL Take 1 tablet (325 mg total) by mouth 2 (two) times daily. What changed:  Another medication with the same name was added. Make sure you understand how and when to take each.   ferrous sulfate 325 (65 FE) MG tablet Take 1 tablet (325 mg total) by mouth 2 (two) times daily with a meal. What changed:  You were already taking a medication with the same name, and this prescription was added. Make sure you understand how and when to take each.   ibuprofen 600 MG tablet Commonly known as:  ADVIL,MOTRIN Take 1 tablet (600 mg total) by  mouth every 6 (six) hours.   omeprazole 20 MG capsule Commonly known as:  PRILOSEC Take 1 capsule (20 mg total) by mouth 2 (two) times daily before a meal.   ondansetron 8 MG disintegrating tablet Commonly known as:  ZOFRAN ODT Take 1 tablet (8 mg total) by mouth every 8 (eight) hours as needed for nausea or vomiting.   PRENATE PIXIE 10-0.6-0.4-200 MG Caps Take 1 tablet by mouth daily.   Vitamin D (Ergocalciferol) 50000 units Caps capsule Commonly known as:  DRISDOL Take 1 capsule (50,000 Units total) by mouth every 7 (seven) days.       Diet: routine diet  Activity: Advance as tolerated. Pelvic rest for 6 weeks.   Outpatient follow up:4 weeks Follow up Appt: Future Appointments  Date Time Provider Department Center  04/11/2018  1:15 PM [facility] NURSE [facility] None  05/03/2018  8:45 AM Brock Bad, MD [facility] None   Follow up Visit:No follow-ups on file.  Postpartum contraception: Nexplanon  Newborn Data: Live born female  Birth Weight: 7 lb 3.9 oz (3285 g) APGAR: 8, 9  Newborn Delivery   Birth date/time:  04/04/2018 23:48:00 Delivery type:  Vaginal, Spontaneous     Baby Feeding: Breast Disposition:home with mother   04/06/2018 Denise Kitty, MD   OB FELLOW DISCHARGE ATTESTATION  I have seen and examined this patient and agree with above documentation in the resident's note.   Denise Mclaughlin, D.O. OB Fellow  04/06/2018, 2:53 PM

## 2018-04-08 LAB — BIRTH TISSUE RECOVERY COLLECTION (PLACENTA DONATION)

## 2018-04-09 ENCOUNTER — Ambulatory Visit (HOSPITAL_COMMUNITY): Admission: RE | Admit: 2018-04-09 | Payer: Medicaid Other | Source: Ambulatory Visit

## 2018-04-09 ENCOUNTER — Encounter: Payer: Medicaid Other | Admitting: Obstetrics & Gynecology

## 2018-04-16 ENCOUNTER — Encounter: Payer: Medicaid Other | Admitting: Obstetrics

## 2018-04-16 ENCOUNTER — Ambulatory Visit (HOSPITAL_COMMUNITY): Payer: Medicaid Other

## 2018-05-03 ENCOUNTER — Ambulatory Visit: Payer: Medicaid Other

## 2018-05-03 ENCOUNTER — Ambulatory Visit: Payer: Medicaid Other | Admitting: Obstetrics

## 2018-05-21 ENCOUNTER — Ambulatory Visit (INDEPENDENT_AMBULATORY_CARE_PROVIDER_SITE_OTHER): Payer: Medicaid Other | Admitting: Obstetrics

## 2018-05-21 ENCOUNTER — Encounter: Payer: Self-pay | Admitting: Obstetrics

## 2018-05-21 ENCOUNTER — Encounter: Payer: Self-pay | Admitting: *Deleted

## 2018-05-21 ENCOUNTER — Other Ambulatory Visit: Payer: Self-pay

## 2018-05-21 DIAGNOSIS — Z3202 Encounter for pregnancy test, result negative: Secondary | ICD-10-CM | POA: Diagnosis not present

## 2018-05-21 DIAGNOSIS — Z1389 Encounter for screening for other disorder: Secondary | ICD-10-CM

## 2018-05-21 DIAGNOSIS — Z3046 Encounter for surveillance of implantable subdermal contraceptive: Secondary | ICD-10-CM | POA: Diagnosis not present

## 2018-05-21 DIAGNOSIS — Z30017 Encounter for initial prescription of implantable subdermal contraceptive: Secondary | ICD-10-CM

## 2018-05-21 LAB — POCT URINE PREGNANCY: Preg Test, Ur: NEGATIVE

## 2018-05-21 MED ORDER — ETONOGESTREL 68 MG ~~LOC~~ IMPL
68.0000 mg | DRUG_IMPLANT | Freq: Once | SUBCUTANEOUS | Status: AC
  Administered 2018-05-21: 68 mg via SUBCUTANEOUS

## 2018-05-21 NOTE — Progress Notes (Signed)
Subjective:    Wants Nexplanon Insertion.  UPT today is NEGATIVE.    Denise Mclaughlin is a 22 y.o. female who presents for a postpartum visit. She is 6 weeks postpartum following a spontaneous vaginal delivery. I have fully reviewed the prenatal and intrapartum course. The delivery was at 36.1 gestational weeks. Outcome: spontaneous vaginal delivery. Anesthesia: epidural. Postpartum course has been Unremarkable. Baby's course has been Unremarkable. Baby is feeding by bottle [facility]. Bleeding no bleeding. Bowel function is normal. Bladder function is normal. Patient is not sexually active. Contraception method is none. Postpartum depression screening: negative.  The following portions of the patient's history were reviewed and updated as appropriate: allergies, current medications, past family history, past medical history, past social history, past surgical history and problem list.  Review of Systems A comprehensive review of systems was negative.   Objective:    BP 120/79   Pulse 94   Ht 5\' 6"  (1.676 m)   Wt (!) 310 lb 12.8 oz (141 kg)   Breastfeeding? No   BMI 50.16 kg/m   General:  alert and no distress   Breasts:  inspection negative, no nipple discharge or bleeding, no masses or nodularity palpable  Lungs: clear to auscultation bilaterally  Heart:  regular rate and rhythm, S1, S2 normal, no murmur, click, rub or gallop   Assessment:     1. Postpartum care following vaginal delivery  2. Nexplanon insertion Rx: - POCT urine pregnancy  Plan:    1. Contraception: Nexplanon 2. Continue PNV's 3. Follow up in: 2 weeks   Brock Bad MD 05-21-2018   Nexplanon Procedure Note   PRE-OP DIAGNOSIS: desired long-term, reversible contraception ( LARC ) POST-OP DIAGNOSIS: Same  PROCEDURE: Nexplanon  placement Performing Provider: Brock Bad MD  Patient education prior to procedure, explained risk, benefits of Nexplanon, reviewed alternative options.  Patient reported understanding. Gave consent to continue with procedure.   PROCEDURE:  Pregnancy Text :  Negative Site (check):      left arm         Sterile Preparation:   Betadinex3 Lot # [id number] Expiration Date 2022  JAN  21  Insertion site was selected 8 - 10 cm from medial epicondyle and marked along with guiding site using sterile marker. Procedure area was prepped and draped in a sterile fashion. 1% Lidocaine 1.5 ml given prior to procedure. Nexplanon  was inserted subcutaneously.Needle was removed from the insertion site. Nexplanon capsule was palpated by provider and patient to assure satisfactory placement. Dressing applied.  Followup: The patient tolerated the procedure well without complications.  Standard post-procedure care is explained and return precautions are given.  Brock Bad MD 05-21-2018

## 2018-05-22 ENCOUNTER — Encounter: Payer: Self-pay | Admitting: Obstetrics

## 2018-06-04 ENCOUNTER — Ambulatory Visit: Payer: Medicaid Other | Admitting: Nurse Practitioner

## 2018-06-04 ENCOUNTER — Ambulatory Visit: Payer: Medicaid Other | Admitting: Obstetrics

## 2018-06-19 ENCOUNTER — Emergency Department (HOSPITAL_COMMUNITY)
Admission: EM | Admit: 2018-06-19 | Discharge: 2018-06-19 | Discharge status: Against Medical Advice | Payer: Medicaid Other

## 2018-08-12 IMAGING — US US MFM FETAL BPP W/O NON-STRESS
1 series · 14 of 28 positions shown · non-contrast
Comparison: none

[Series 1: us mfm fetal bpp w/o non-stress · 33 acquisitions, 14 frames shown]
[im 2/33]
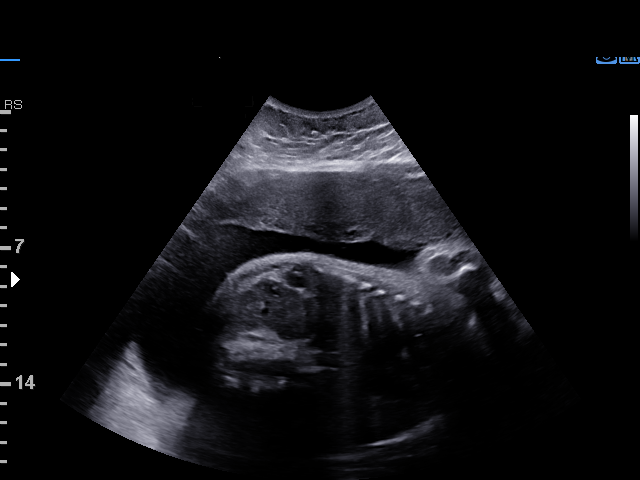
[im 4/33]
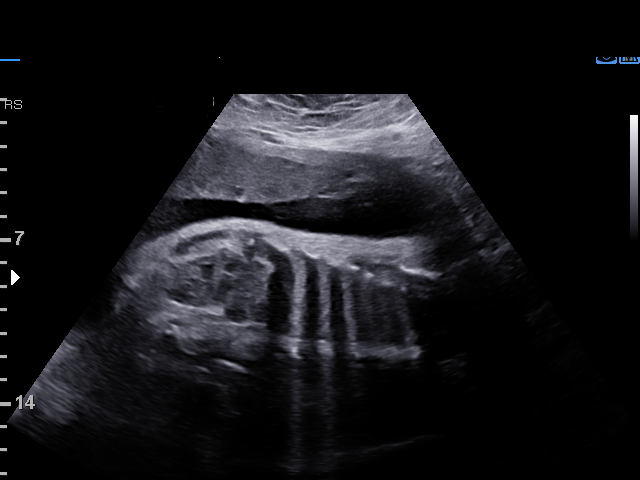
[im 6/33]
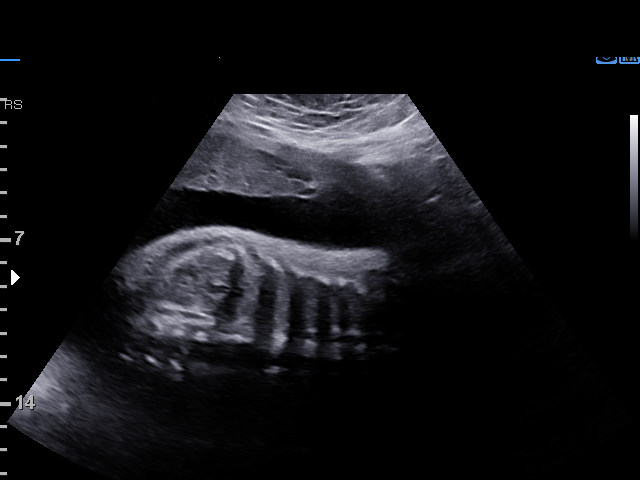
[im 9/33]
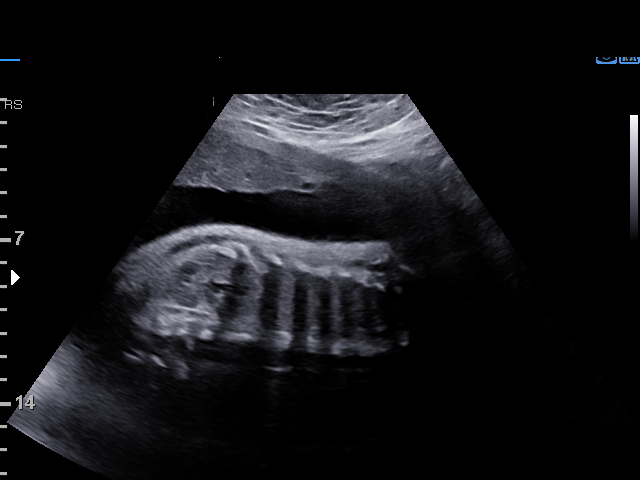
[im 11/33]
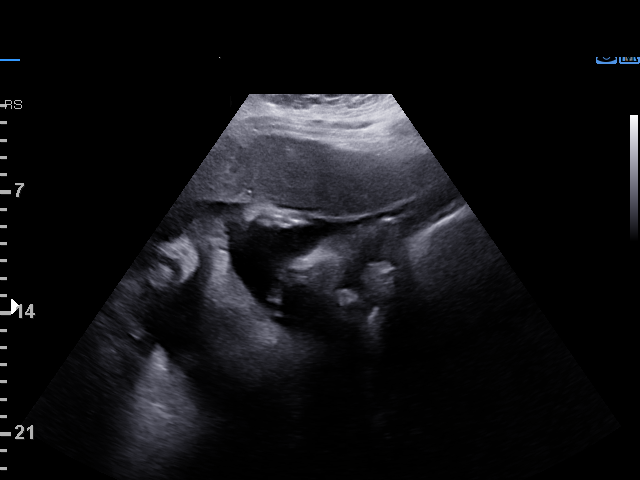
[im 14/33]
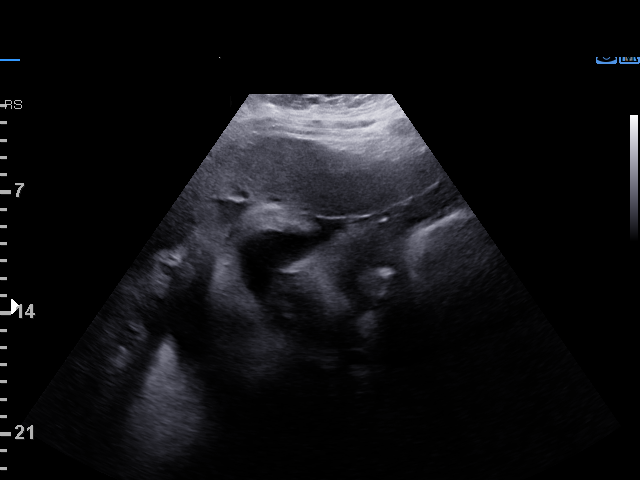
[im 16/33]
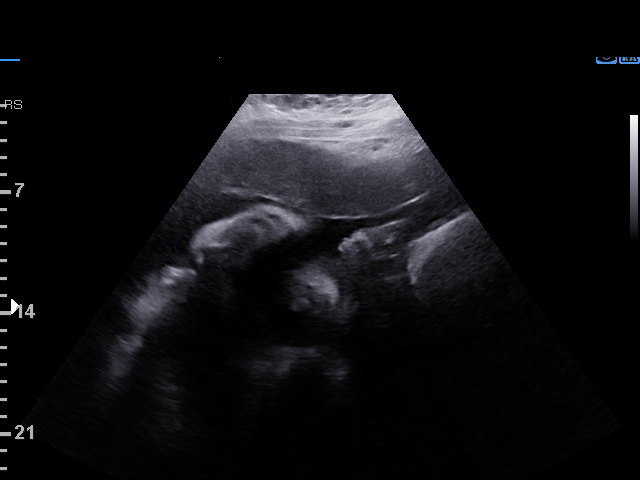
[im 18/33]
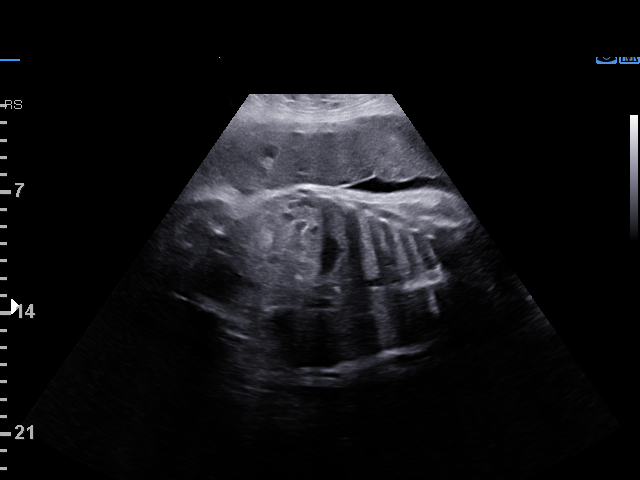
[im 21/33]
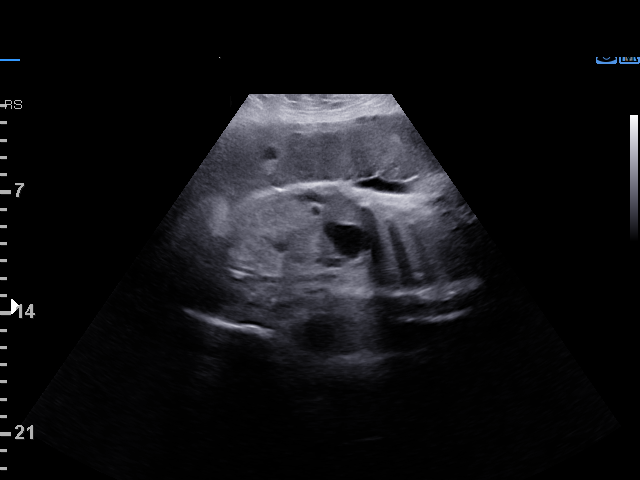
[im 23/33]
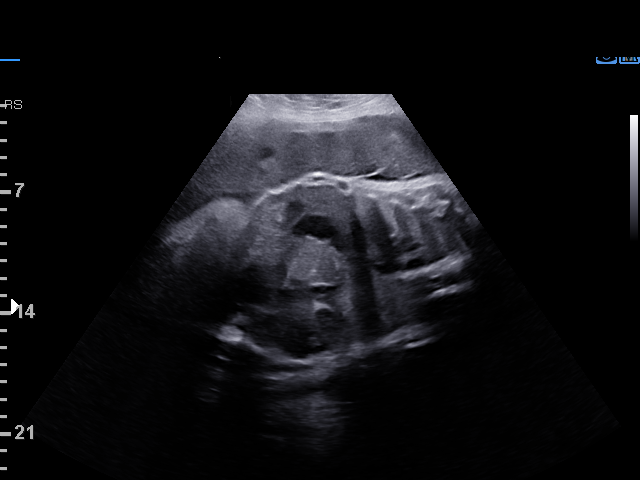
[im 25/33]
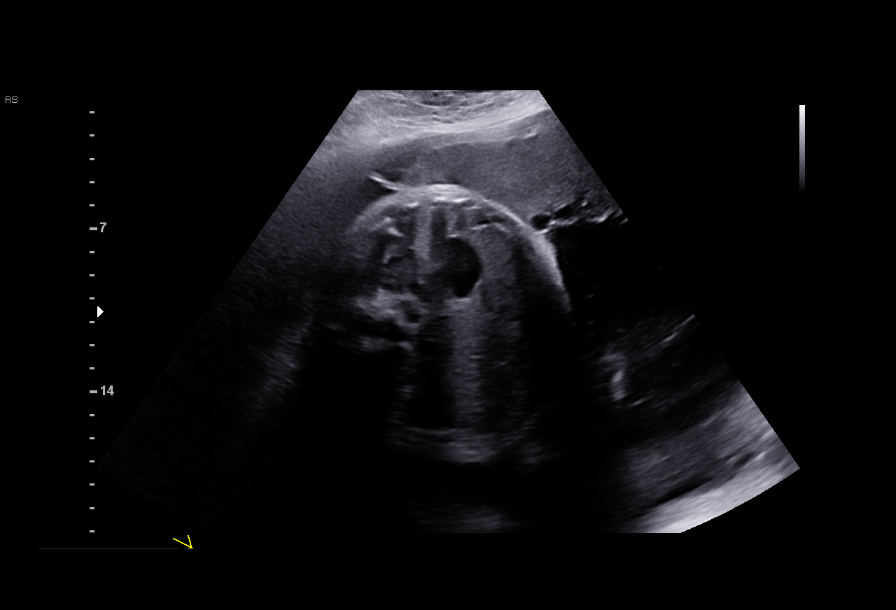
[im 28/33]
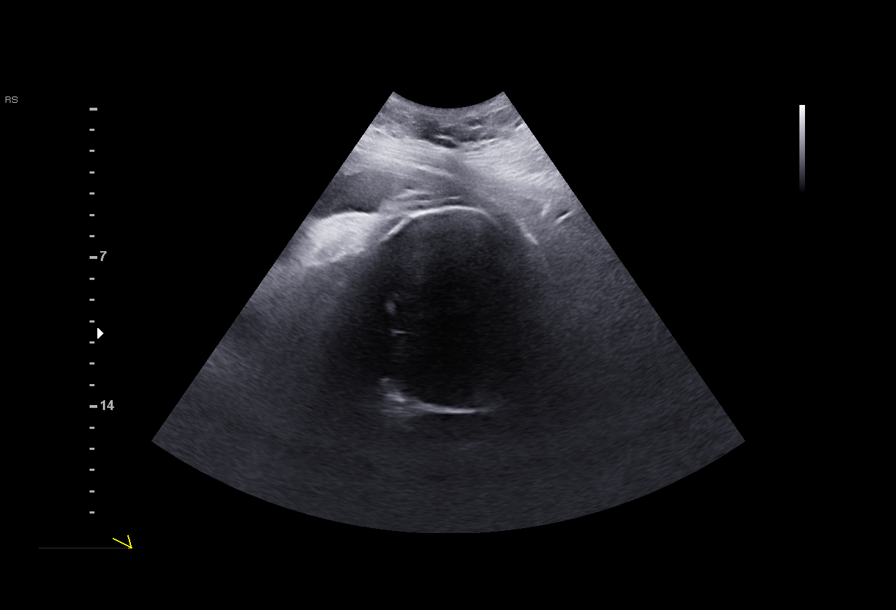
[im 30/33]
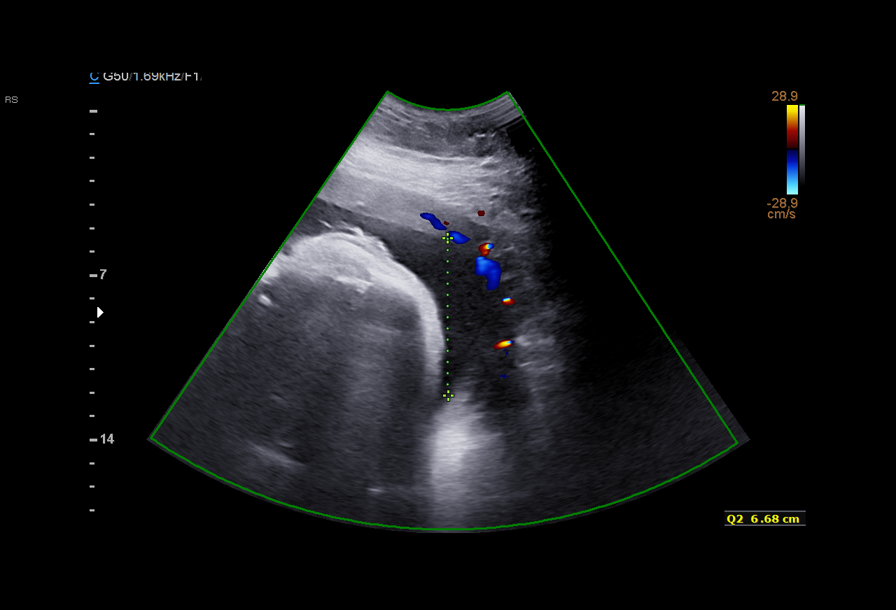
[im 33/33]
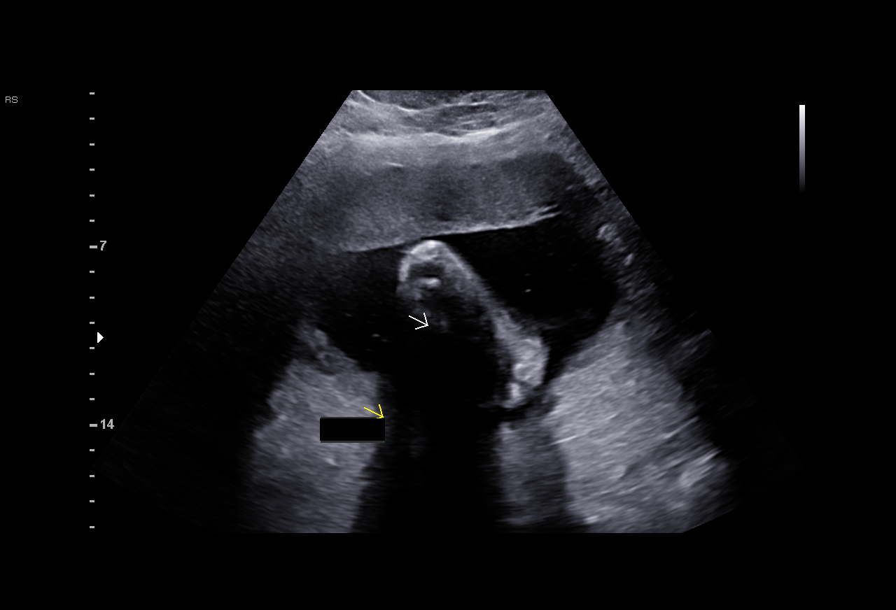

[14 of 28 positions shown; findings below may reference images not displayed]

Road [HOSPITAL]

Indications

35 weeks gestation of pregnancy
Fetal abnormality - other known or
suspected (thickened nuchal fold) - low risk
NIPS
Polyhydramnios, third trimester, antepartum
condition or complication, unspecified fetus
Obesity complicating pregnancy, third
trimester
OB History

Blood Type:            Height:  5'6"   Weight (lb):  301       BMI:
Gravidity:    2         Term:   1
Living:       1
Fetal Evaluation

Num Of Fetuses:     1
Fetal Heart         150
Rate(bpm):
Cardiac Activity:   Observed
Presentation:       Cephalic

Amniotic Fluid
AFI FV:      Polyhydramnios

AFI Sum(cm)     %Tile       Largest Pocket(cm)
27.12           96

RUQ(cm)       RLQ(cm)       LUQ(cm)        LLQ(cm)
8.28
Biophysical Evaluation

Amniotic F.V:   Pocket => 2 cm two         F. Tone:        Observed
planes
F. Movement:    Observed                   Score:          [DATE]
F. Breathing:   Observed
Gestational Age

LMP:           35w 6d        Date:  07/25/17                 EDD:   05/01/18
Best:          35w 6d     Det. By:  LMP  (07/25/17)          EDD:   05/01/18
Impression

Mild polyhydramnios is seen again.
BPP [DATE].

Patient does not have symptoms of preterm labor.
Recommendations

Continue weekly antenatal testing till delivery.

## 2018-08-14 IMAGING — US US MFM FETAL BPP W/O NON-STRESS
1 series · 15 of 17 positions shown · non-contrast
Comparison: none

[Series 1: us mfm fetal bpp w/o non-stress · 17 acquisitions, 15 frames shown]
[im 1/17]
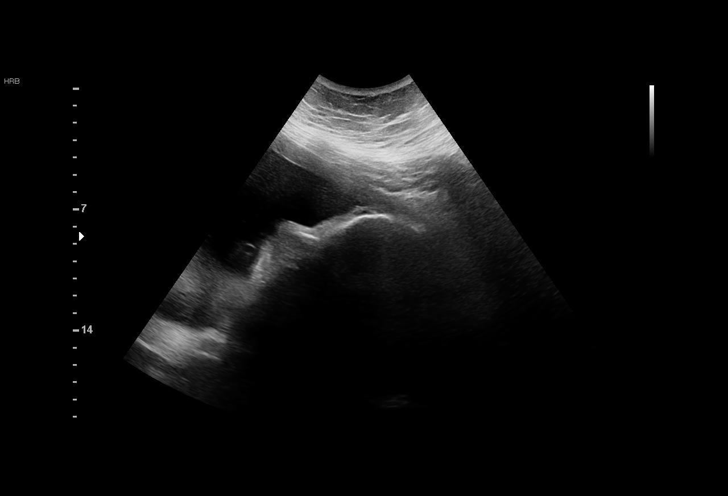
[im 2/17]
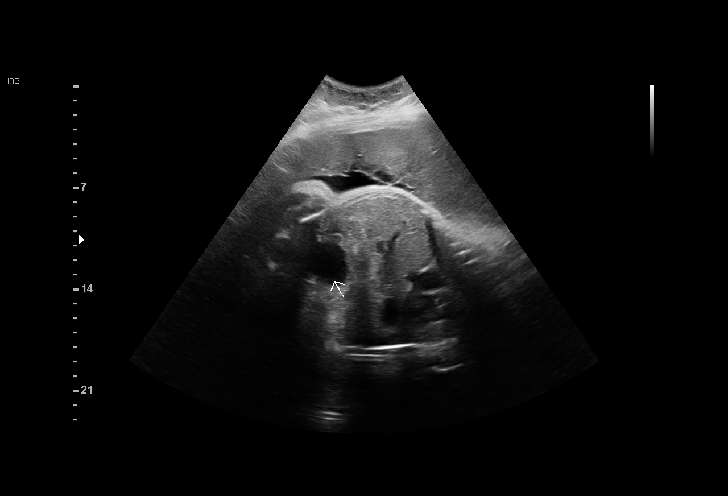
[im 3/17]
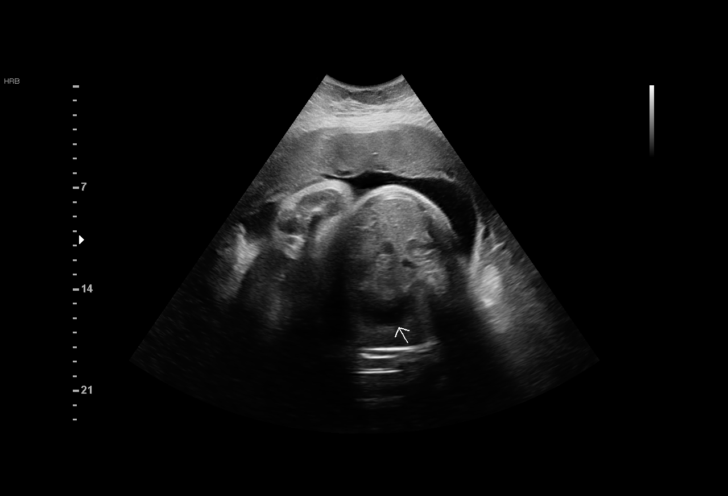
[im 4/17]
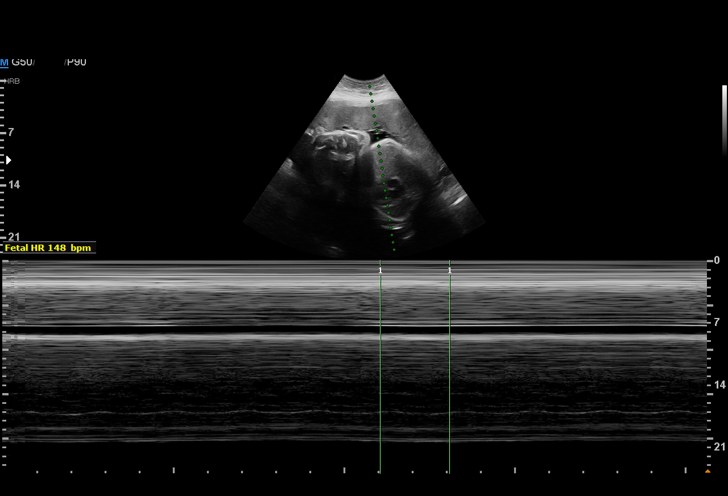
[im 6/17]
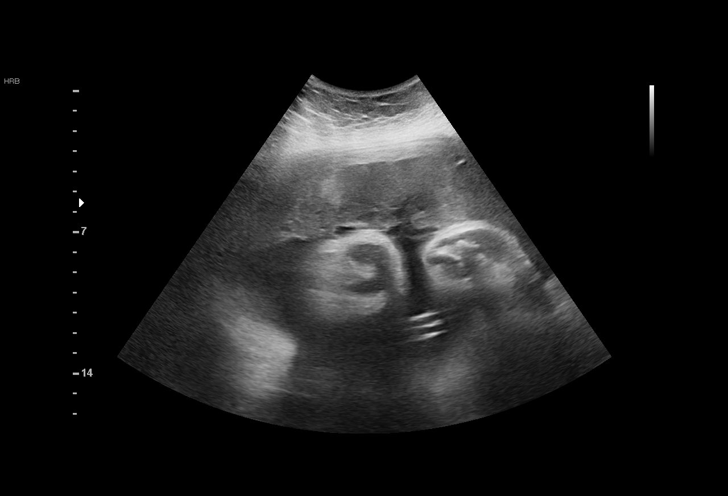
[im 7/17]
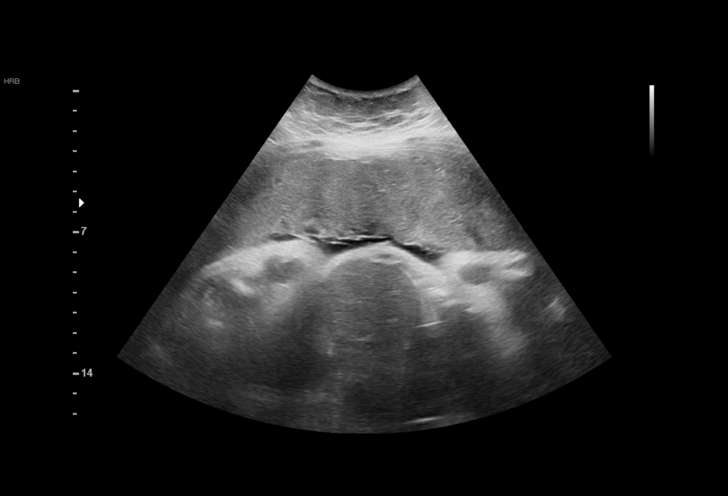
[im 8/17]
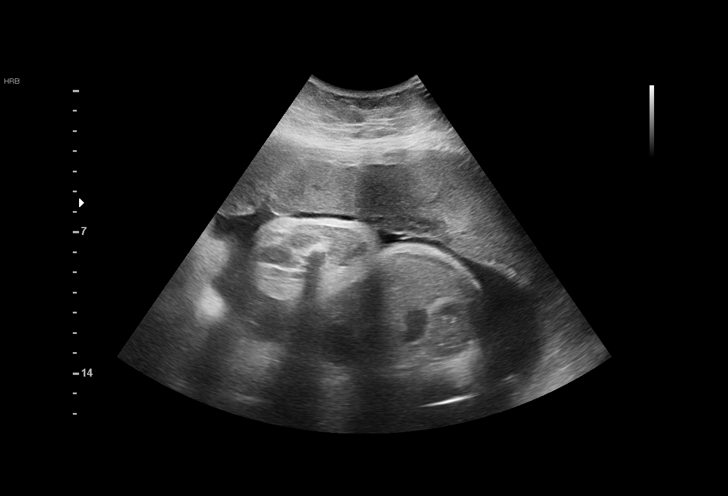
[im 9/17]
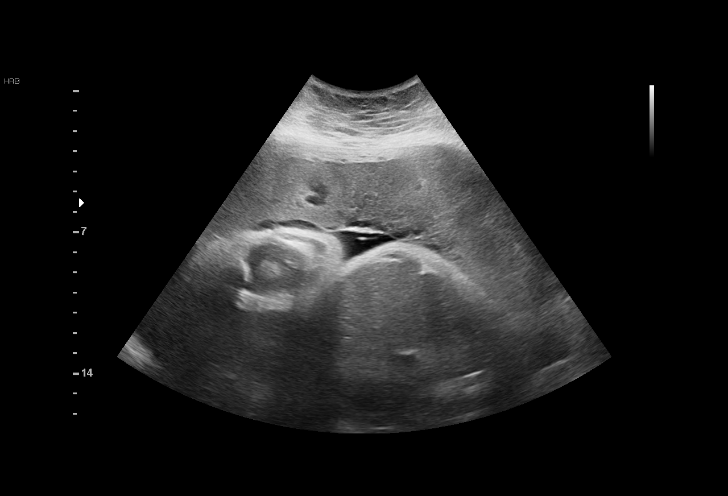
[im 10/17]
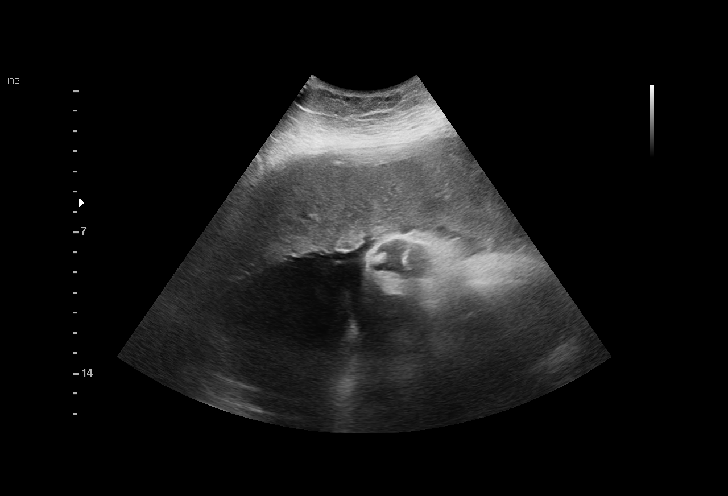
[im 11/17]
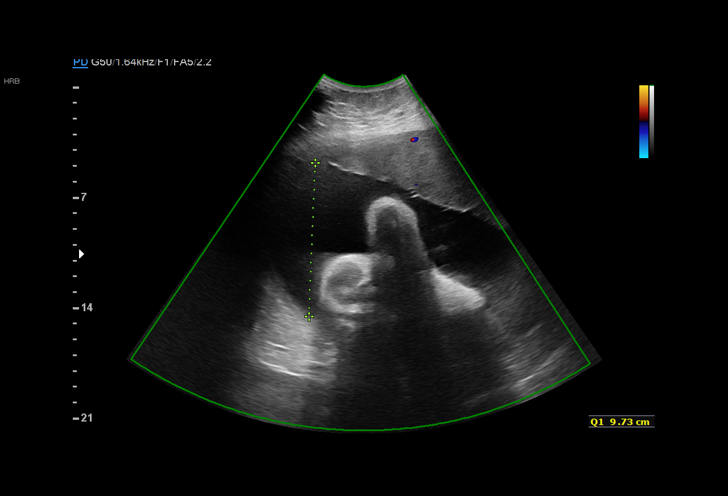
[im 12/17]
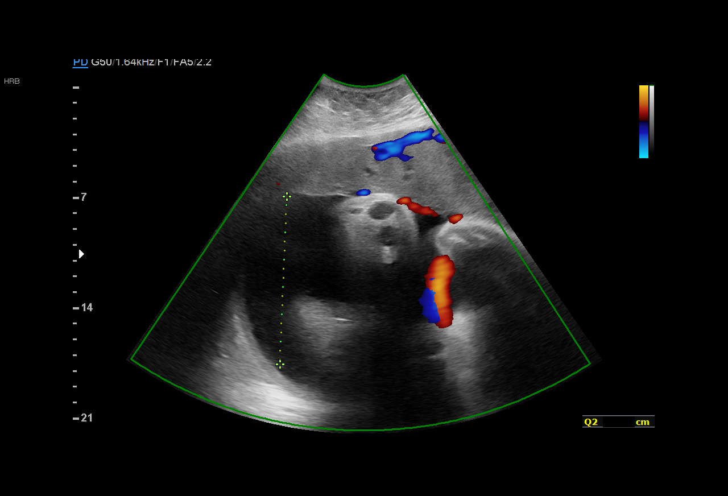
[im 14/17]
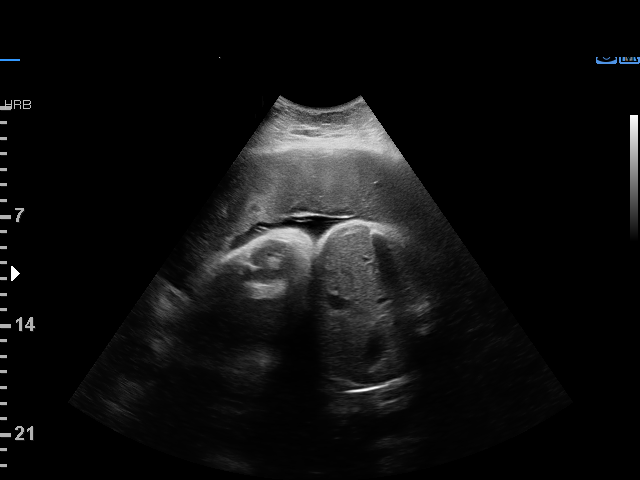
[im 15/17]
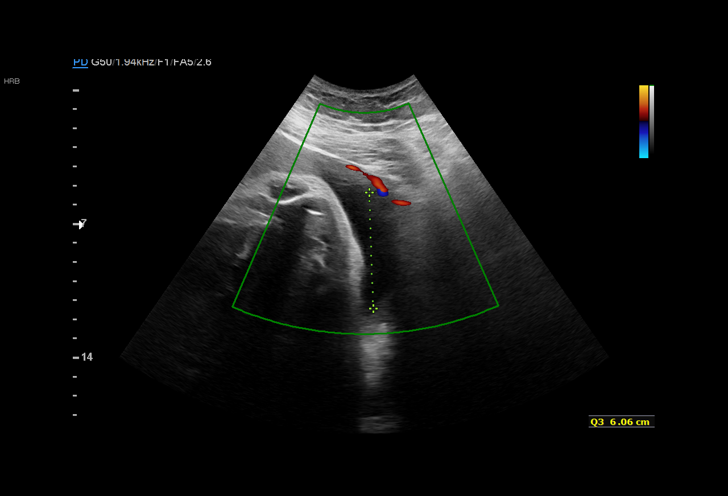
[im 16/17]
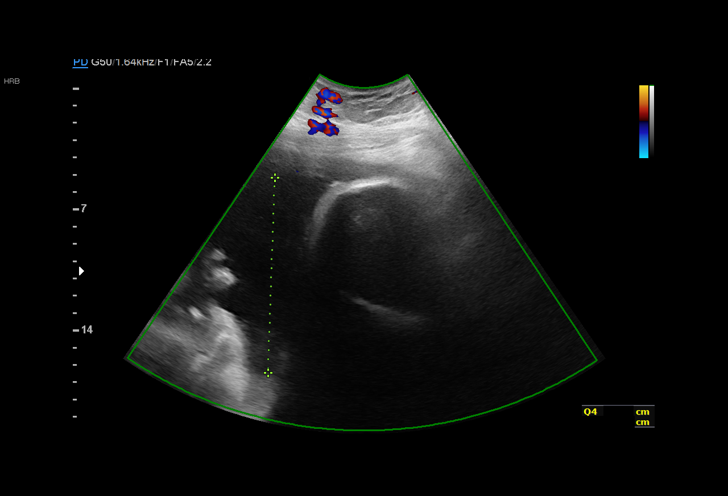
[im 17/17]
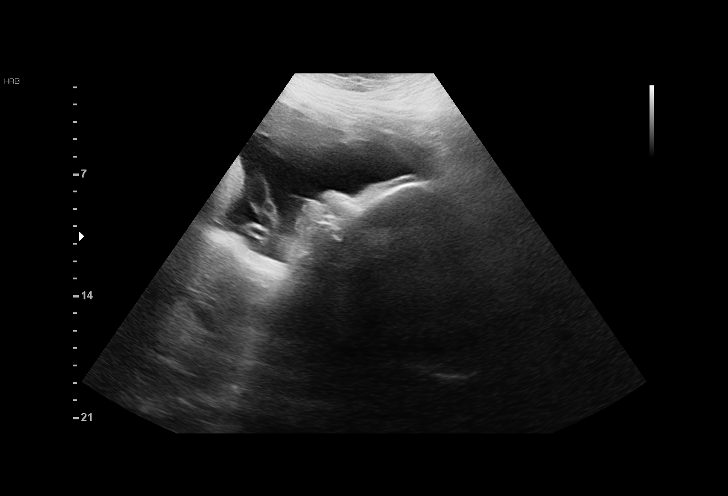

[15 of 17 positions shown; findings below may reference images not displayed]

Road [HOSPITAL]

1  LORRAINE JIM           [id number]      [id number]     [id number]
Indications

36 weeks gestation of pregnancy
Fetal abnormality - other known or
suspected (thickened nuchal fold) - low risk
NIPS
Polyhydramnios, third trimester, antepartum
condition or complication, unspecified fetus
Obesity complicating pregnancy, third
trimester
OB History

Blood Type:            Height:  5'6"   Weight (lb):  301       BMI:
Gravidity:    2         Term:   1
Living:       1
Fetal Evaluation

Num Of Fetuses:     1
Fetal Heart         148
Rate(bpm):
Cardiac Activity:   Observed
Presentation:       Cephalic
Placenta:           Anterior
P. Cord Insertion:  Previously Visualized

Amniotic Fluid
AFI FV:      Polyhydramnios

AFI Sum(cm)     %Tile       Largest Pocket(cm)
37.7            > 97

RUQ(cm)       RLQ(cm)       LUQ(cm)        LLQ(cm)
9.73
Biophysical Evaluation

Amniotic F.V:   Polyhydramnios             F. Tone:        Not Observed
F. Movement:    Not Observed               Score:          [DATE]
F. Breathing:   Not Observed
Gestational Age

LMP:           36w 1d        Date:  07/25/17                 EDD:   05/01/18
Best:          36w 1d     Det. By:  LMP  (07/25/17)          EDD:   05/01/18
Anatomy

Thoracic:              Appears normal         Bladder:                Appears normal
Stomach:               Appears normal, left
sided
Cervix Uterus Adnexa

Cervix
Not visualized (advanced GA >14wks)
Impression

Patient is being evaluated for uterine contractions. She had
nonreactive NST.
Polyhydramnios is seen again.
BPP [DATE] or [DATE] including NST.

Polyhydramnios can be associated with fetal anomalies that
may be evident only postnatally.
Recommendations

Recommend delivery now.
D/w Dr. Plumaj.

## 2019-04-27 ENCOUNTER — Emergency Department (HOSPITAL_COMMUNITY): Payer: Medicaid Other

## 2019-04-27 ENCOUNTER — Other Ambulatory Visit: Payer: Self-pay

## 2019-04-27 ENCOUNTER — Encounter (HOSPITAL_COMMUNITY): Payer: Self-pay

## 2019-04-27 ENCOUNTER — Inpatient Hospital Stay (HOSPITAL_COMMUNITY)
Admission: EM | Admit: 2019-04-27 | Discharge: 2019-05-13 | DRG: 918 | Disposition: E | Payer: Medicaid Other | Attending: Specialist | Admitting: Specialist

## 2019-04-27 DIAGNOSIS — Z79899 Other long term (current) drug therapy: Secondary | ICD-10-CM | POA: Diagnosis not present

## 2019-04-27 DIAGNOSIS — Z8249 Family history of ischemic heart disease and other diseases of the circulatory system: Secondary | ICD-10-CM

## 2019-04-27 DIAGNOSIS — Z8659 Personal history of other mental and behavioral disorders: Secondary | ICD-10-CM | POA: Diagnosis not present

## 2019-04-27 DIAGNOSIS — Z818 Family history of other mental and behavioral disorders: Secondary | ICD-10-CM | POA: Diagnosis not present

## 2019-04-27 DIAGNOSIS — Z20828 Contact with and (suspected) exposure to other viral communicable diseases: Secondary | ICD-10-CM | POA: Diagnosis present

## 2019-04-27 DIAGNOSIS — F1211 Cannabis abuse, in remission: Secondary | ICD-10-CM | POA: Diagnosis present

## 2019-04-27 DIAGNOSIS — Z87891 Personal history of nicotine dependence: Secondary | ICD-10-CM | POA: Diagnosis not present

## 2019-04-27 DIAGNOSIS — T391X4A Poisoning by 4-Aminophenol derivatives, undetermined, initial encounter: Secondary | ICD-10-CM | POA: Diagnosis present

## 2019-04-27 DIAGNOSIS — K920 Hematemesis: Secondary | ICD-10-CM | POA: Diagnosis present

## 2019-04-27 DIAGNOSIS — F319 Bipolar disorder, unspecified: Secondary | ICD-10-CM | POA: Diagnosis present

## 2019-04-27 DIAGNOSIS — I469 Cardiac arrest, cause unspecified: Secondary | ICD-10-CM | POA: Diagnosis present

## 2019-04-27 DIAGNOSIS — Z791 Long term (current) use of non-steroidal anti-inflammatories (NSAID): Secondary | ICD-10-CM

## 2019-04-27 DIAGNOSIS — I959 Hypotension, unspecified: Secondary | ICD-10-CM | POA: Diagnosis present

## 2019-04-27 DIAGNOSIS — R402432 Glasgow coma scale score 3-8, at arrival to emergency department: Secondary | ICD-10-CM | POA: Diagnosis present

## 2019-04-27 DIAGNOSIS — Z6841 Body Mass Index (BMI) 40.0 and over, adult: Secondary | ICD-10-CM | POA: Diagnosis not present

## 2019-04-27 LAB — URINALYSIS, ROUTINE W REFLEX MICROSCOPIC
Bilirubin Urine: NEGATIVE
Glucose, UA: 50 mg/dL — AB
Hgb urine dipstick: NEGATIVE
Ketones, ur: 5 mg/dL — AB
Nitrite: NEGATIVE
Protein, ur: 30 mg/dL — AB
Specific Gravity, Urine: 1.019 (ref 1.005–1.030)
pH: 5 (ref 5.0–8.0)

## 2019-04-27 LAB — COMPREHENSIVE METABOLIC PANEL
ALT: 2037 U/L — ABNORMAL HIGH (ref 0–44)
AST: 1924 U/L — ABNORMAL HIGH (ref 15–41)
Albumin: 2.5 g/dL — ABNORMAL LOW (ref 3.5–5.0)
Alkaline Phosphatase: 147 U/L — ABNORMAL HIGH (ref 38–126)
Anion gap: 27 — ABNORMAL HIGH (ref 5–15)
BUN: 28 mg/dL — ABNORMAL HIGH (ref 6–20)
CO2: 16 mmol/L — ABNORMAL LOW (ref 22–32)
Calcium: 9.1 mg/dL (ref 8.9–10.3)
Chloride: 101 mmol/L (ref 98–111)
Creatinine, Ser: 2.92 mg/dL — ABNORMAL HIGH (ref 0.44–1.00)
GFR calc Af Amer: 26 mL/min — ABNORMAL LOW (ref >60)
GFR calc non Af Amer: 22 mL/min — ABNORMAL LOW (ref >60)
Glucose, Bld: 179 mg/dL — ABNORMAL HIGH (ref 70–99)
Potassium: 6.4 mmol/L (ref 3.5–5.1)
Sodium: 144 mmol/L (ref 135–145)
Total Bilirubin: 0.7 mg/dL (ref 0.3–1.2)
Total Protein: 5.6 g/dL — ABNORMAL LOW (ref 6.5–8.1)

## 2019-04-27 LAB — POCT I-STAT EG7
Acid-base deficit: 23 mmol/L — ABNORMAL HIGH (ref 0.0–2.0)
Bicarbonate: 13 mmol/L — ABNORMAL LOW (ref 20.0–28.0)
Calcium, Ion: 1.04 mmol/L — ABNORMAL LOW (ref 1.15–1.40)
HCT: 38 % (ref 36.0–46.0)
Hemoglobin: 12.9 g/dL (ref 12.0–15.0)
O2 Saturation: 36 %
Potassium: 6.1 mmol/L — ABNORMAL HIGH (ref 3.5–5.1)
Sodium: 140 mmol/L (ref 135–145)
TCO2: 16 mmol/L — ABNORMAL LOW (ref 22–32)
pCO2, Ven: 84.5 mmHg (ref 44.0–60.0)
pH, Ven: 6.797 — CL (ref 7.250–7.430)
pO2, Ven: 41 mmHg (ref 32.0–45.0)

## 2019-04-27 LAB — RAPID URINE DRUG SCREEN, HOSP PERFORMED
Amphetamines: NOT DETECTED
Barbiturates: NOT DETECTED
Benzodiazepines: POSITIVE — AB
Cocaine: NOT DETECTED
Opiates: NOT DETECTED
Tetrahydrocannabinol: POSITIVE — AB

## 2019-04-27 LAB — CBC
HCT: 43.7 % (ref 36.0–46.0)
Hemoglobin: 11.6 g/dL — ABNORMAL LOW (ref 12.0–15.0)
MCH: 21.3 pg — ABNORMAL LOW (ref 26.0–34.0)
MCHC: 26.5 g/dL — ABNORMAL LOW (ref 30.0–36.0)
MCV: 80.3 fL (ref 80.0–100.0)
Platelets: 295 10*3/uL (ref 150–400)
RBC: 5.44 MIL/uL — ABNORMAL HIGH (ref 3.87–5.11)
RDW: 19.6 % — ABNORMAL HIGH (ref 11.5–15.5)
WBC: 21.6 10*3/uL — ABNORMAL HIGH (ref 4.0–10.5)
nRBC: 5.5 % — ABNORMAL HIGH (ref 0.0–0.2)

## 2019-04-27 LAB — SALICYLATE LEVEL: Salicylate Lvl: 9.7 mg/dL (ref 2.8–30.0)

## 2019-04-27 LAB — I-STAT BETA HCG BLOOD, ED (MC, WL, AP ONLY): I-stat hCG, quantitative: <5 m[IU]/mL (ref <5)

## 2019-04-27 LAB — ACETAMINOPHEN LEVEL: Acetaminophen (Tylenol), Serum: <10 ug/mL — ABNORMAL LOW (ref 10–30)

## 2019-04-27 LAB — LACTIC ACID, PLASMA: Lactic Acid, Venous: >11 mmol/L (ref 0.5–1.9)

## 2019-04-27 LAB — TROPONIN I (HIGH SENSITIVITY): Troponin I (High Sensitivity): 276 ng/L (ref <18)

## 2019-04-27 LAB — SARS CORONAVIRUS 2 BY RT PCR (HOSPITAL ORDER, PERFORMED IN ~~LOC~~ HOSPITAL LAB): SARS Coronavirus 2: NEGATIVE

## 2019-04-27 LAB — ETHANOL: Alcohol, Ethyl (B): 17 mg/dL — ABNORMAL HIGH (ref <10)

## 2019-04-27 MED ORDER — IPRATROPIUM-ALBUTEROL 0.5-2.5 (3) MG/3ML IN SOLN: 3.0000 mL | Freq: Four times a day (QID) | RESPIRATORY_TRACT | Status: DC

## 2019-04-27 MED ORDER — SODIUM BICARBONATE 8.4 % IV SOLN
INTRAVENOUS | Status: AC | PRN
  Administered 2019-04-27: 50 meq via INTRAVENOUS

## 2019-04-27 MED ORDER — ENOXAPARIN SODIUM 40 MG/0.4ML ~~LOC~~ SOLN: 40.0000 mg | Freq: Every day | SUBCUTANEOUS | Status: DC

## 2019-04-27 MED ORDER — SODIUM BICARBONATE 8.4 % IV SOLN
INTRAVENOUS | Status: AC
  Filled 2019-04-27: qty 50

## 2019-04-27 MED ORDER — EPINEPHRINE 1 MG/10ML IJ SOSY
PREFILLED_SYRINGE | INTRAMUSCULAR | Status: AC | PRN
  Administered 2019-04-27: 1 mg via INTRAVENOUS

## 2019-04-27 MED ORDER — CALCIUM CHLORIDE 10 % IV SOLN
INTRAVENOUS | Status: AC | PRN
  Administered 2019-04-27: 1 g via INTRAVENOUS

## 2019-04-27 MED ORDER — EPINEPHRINE HCL 5 MG/250ML IV SOLN IN NS
0.5000 ug/min | INTRAVENOUS | Status: DC
  Administered 2019-04-27: 10 ug/min via INTRAVENOUS
  Filled 2019-04-27 (×2): qty 250

## 2019-04-27 MED ORDER — SODIUM CHLORIDE 0.9 % IV SOLN: INTRAVENOUS | Status: DC

## 2019-04-27 MED ORDER — SODIUM CHLORIDE 0.9 % IV SOLN: 2.0000 g | Freq: Two times a day (BID) | INTRAVENOUS | Status: DC

## 2019-04-27 MED ORDER — VANCOMYCIN VARIABLE DOSE PER UNSTABLE RENAL FUNCTION (PHARMACIST DOSING): Status: DC

## 2019-04-27 MED ORDER — SODIUM BICARBONATE 8.4 % IV SOLN
INTRAVENOUS | Status: DC
  Administered 2019-04-27: 06:00:00 via INTRAVENOUS

## 2019-04-27 MED ORDER — NOREPINEPHRINE 4 MG/250ML-% IV SOLN
0.0000 ug/min | INTRAVENOUS | Status: DC
  Administered 2019-04-27: 20 ug/min via INTRAVENOUS

## 2019-04-27 MED ORDER — FAMOTIDINE IN NACL 20-0.9 MG/50ML-% IV SOLN: 20.0000 mg | Freq: Two times a day (BID) | INTRAVENOUS | Status: DC

## 2019-04-27 MED ORDER — VANCOMYCIN HCL 10 G IV SOLR
2000.0000 mg | Freq: Once | INTRAVENOUS | Status: DC
  Filled 2019-04-27: qty 2000

## 2019-04-28 MED FILL — Medication: Qty: 1 | Status: AC

## 2019-04-29 LAB — PATHOLOGIST SMEAR REVIEW

## 2019-05-11 MED FILL — Medication: Qty: 2 | Status: AC

## 2019-05-13 NOTE — Code Documentation (Signed)
Pulse check, pulse present, ST

## 2019-05-13 NOTE — Code Documentation (Signed)
Pulse check, no pulses, CPR continued 

## 2019-05-13 NOTE — Code Documentation (Signed)
Pulse check, no pulse, CPR continued.

## 2019-05-13 NOTE — ED Triage Notes (Signed)
Pt comes via [facility], found by family, unresponsive and pulseless, CPR initialed by EMS at 439, obtained ROSC at 515, pulses lost again at 526, PTA received 7 epi. Pt vomited large amount of blood. Pt has had recent SI

## 2019-05-13 NOTE — Code Documentation (Signed)
Pulse check, asystole 

## 2019-05-13 NOTE — ED Provider Notes (Addendum)
[facility] EMERGENCY DEPARTMENT Provider Note   CSN: [id number] Arrival date & time: 05-11-19  0543    History   Chief Complaint Chief Complaint  Patient presents with   Cardiac Arrest    HPI Denise Mclaughlin is a 23 y.o. female.     HPI  This is a 23 year old female who presents by EMS after being found down.  She was reportedly found down by her father.  Unknown downtime.  Per EMS she was in asystole.  Patient received multiple rounds of ACLS.  She did achieve [place].  However, in route, pulses were lost again.  She presents to the ER and active arrest.  She received a total of 7 epinephrine and was started on epinephrine drip by EMS.  King airway in place.  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   Bipolar and related disorder ([place]) 05/21/2015   Bipolar disorder ([place])    Cannabis abuse, continuous use 05/21/2015   Conduct disorder 05/21/2015   Depression    Obesity 05/21/2015   Suicidal ideation 05/21/2015   Urinary tract infection     Patient Active Problem List   Diagnosis Date Noted   Gestational hypertension 04/05/2018   Labor and delivery indication for care or intervention 04/04/2018   Polyhydramnios 02/26/2018   Vitamin D deficiency 10/26/2017   Obesity affecting pregnancy, antepartum 10/24/2017   Rubella non-immune status, antepartum 10/24/2017   Low serum vitamin D 10/24/2017   Supervision of other normal pregnancy, antepartum 10/17/2017   H/O abdominal surgery 10/17/2017   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   Bipolar and related disorder ([place]) 05/21/2015   Obesity, Class III, BMI 40-49.9 (morbid obesity) ([place]) 05/21/2015   Cannabis abuse, continuous use 05/21/2015   Major depressive disorder, single episode, severe without psychotic features ([place])    Acetaminophen  overdose 05/18/2015    Past Surgical History:  Procedure Laterality Date   ABDOMINAL SURGERY     ?pyloric stenosis   MOUTH SURGERY       OB History    Gravida  2   Para  2   Term  1   Preterm  1   AB      Living  2     SAB      TAB      Ectopic      Multiple  0   Live Births  2            Home Medications    Prior to Admission medications   Medication Sig Start Date End Date Taking? Authorizing Provider  acetaminophen (TYLENOL) 325 MG tablet Take 650 mg by mouth every 6 (six) hours as needed for mild pain or headache.    [provider]  ferrous sulfate (FERROUSUL) 325 (65 FE) MG tablet Take 1 tablet (325 mg total) by mouth 2 (two) times daily. 02/13/18   Sloan Leiter, MD  ferrous sulfate 325 (65 FE) MG tablet Take 1 tablet (325 mg total) by mouth 2 (two) times daily with a meal. 04/06/18   Nicolette Bang, DO  ibuprofen (ADVIL,MOTRIN) 600 MG tablet Take 1 tablet (600 mg total) by mouth every 6 (six) hours. 04/06/18   Nicolette Bang, DO  omeprazole (PRILOSEC) 20 MG capsule Take 1 capsule (20 mg total) by mouth 2 (two) times daily before a meal. 10/17/17  Denney, Rachelle A, CNM  ondansetron (ZOFRAN ODT) 8 MG disintegrating tablet Take 1 tablet (8 mg total) by mouth every 8 (eight) hours as needed for nausea or vomiting. Patient not taking: Reported on 05/21/2018 02/17/18   Wende Mott, CNM  Prenat-FeAsp-Meth-FA-DHA w/o A (PRENATE PIXIE) 10-0.6-0.4-200 MG CAPS Take 1 tablet by mouth daily. Patient not taking: Reported on 05/21/2018 10/17/17   Kandis Cocking A, CNM  Vitamin D, Ergocalciferol, (DRISDOL) 50000 units CAPS capsule Take 1 capsule (50,000 Units total) by mouth every 7 (seven) days. Patient not taking: Reported on 04/04/2018 10/26/17   Morene Crocker, CNM    Family History Family History  Problem Relation Age of Onset   Hypertension Father    Depression Mother     Social History Social History   Tobacco Use     Smoking status: Former Smoker    Types: Cigarettes    Quit date: 09/12/2016    Years since quitting: 2.6   Smokeless tobacco: Never Used  Substance Use Topics   Alcohol use: No   Drug use: Not Currently    Types: Marijuana    Comment: stopped with 1st pregnancy     Allergies   Patient has no known allergies.   Review of Systems Review of Systems  Unable to perform ROS: Acuity of condition     Physical Exam Updated Vital Signs BP (!) 118/98    Pulse (!) 124    Temp (!) 95.1 F (35.1 C)    Resp (!) 30    Ht 1.727 m (_0 )    SpO2 (!) 85%    BMI 47.26 kg/m   Physical Exam Vitals signs and nursing note reviewed.  Constitutional:      Comments: Morbidly obese, obtunded, unresponsive  HENT:     Mouth/Throat:     Comments: Dried dark blood noted in the mouth and about the oropharynx Eyes:     Comments: Pupils 7 mm and reactive bilaterally  Cardiovascular:     Comments: No palpable pulses Pulmonary:     Comments: Being bagged, King airway in place Abdominal:     General: There is no distension.     Palpations: Abdomen is soft.  Musculoskeletal:        General: No swelling.     Right lower leg: No edema.     Left lower leg: No edema.  Skin:    General: Skin is dry.  Neurological:     Comments: GCS 3  Psychiatric:     Comments: Unable to assess      ED Treatments / Results  Labs (all labs ordered are listed, but only abnormal results are displayed) Labs Reviewed  CBC - Abnormal; Notable for the following components:      Result Value   WBC 21.6 (*)    RBC 5.44 (*)    Hemoglobin 11.6 (*)    MCH 21.3 (*)    MCHC 26.5 (*)    RDW 19.6 (*)    nRBC 5.5 (*)    All other components within normal limits  LACTIC ACID, PLASMA - Abnormal; Notable for the following components:   Lactic Acid, Venous >11.0 (*)    All other components within normal limits  COMPREHENSIVE METABOLIC PANEL - Abnormal; Notable for the following components:   Potassium 6.4 (*)     CO2 16 (*)    Glucose, Bld 179 (*)    BUN 28 (*)    Creatinine, Ser 2.92 (*)    Total Protein  5.6 (*)    Albumin 2.5 (*)    AST 1,924 (*)    ALT 2,037 (*)    Alkaline Phosphatase 147 (*)    GFR calc non Af Amer 22 (*)    GFR calc Af Amer 26 (*)    Anion gap 27 (*)    All other components within normal limits  ACETAMINOPHEN LEVEL - Abnormal; Notable for the following components:   Acetaminophen (Tylenol), Serum <10 (*)    All other components within normal limits  ETHANOL - Abnormal; Notable for the following components:   Alcohol, Ethyl (B) 17 (*)    All other components within normal limits  POCT I-STAT EG7 - Abnormal; Notable for the following components:   pH, Ven 6.797 (*)    pCO2, Ven 84.5 (*)    Bicarbonate 13.0 (*)    TCO2 16 (*)    Acid-base deficit 23.0 (*)    Potassium 6.1 (*)    Calcium, Ion 1.04 (*)    All other components within normal limits  TROPONIN I (HIGH SENSITIVITY) - Abnormal; Notable for the following components:   Troponin I (High Sensitivity) 276 (*)    All other components within normal limits  SARS CORONAVIRUS 2 (HOSPITAL ORDER, [facility])  SALICYLATE LEVEL  LACTIC ACID, PLASMA  URINALYSIS, ROUTINE W REFLEX MICROSCOPIC  RAPID URINE DRUG SCREEN, HOSP PERFORMED  PATHOLOGIST SMEAR REVIEW  I-STAT VENOUS BLOOD GAS, ED  I-STAT BETA HCG BLOOD, ED ([facility], [facility], [facility] ONLY)    EKG None  ED ECG REPORT   Date: 01-May-2019  Rate: 100  Rhythm: normal sinus rhythm  QRS Axis: normal  Intervals: normal  ST/T Wave abnormalities: normal  Conduction Disutrbances:nonspecific intraventricular conduction delay  Narrative Interpretation:   Old EKG Reviewed: none available  I have personally reviewed the EKG tracing and disagree with the computerized printout as noted.  Radiology Dg Chest Portable 1 View  Result Date: 05-01-19 CLINICAL DATA:  Found unresponsive. S/p CPR. Hematemesis. EXAM: PORTABLE CHEST 1 VIEW COMPARISON:   08/23/2015 FINDINGS: Endotracheal tube tip is in the right mainstem bronchus, approximately 2 cm below the carina. A nasogastric tube is seen entering the stomach. Patchy bilateral airspace disease is seen throughout both lungs. Low lung volumes noted. No pneumothorax or pleural effusion visualized. Heart size is normal. IMPRESSION: 1. Endotracheal tube tip in right mainstem bronchus. 2. Bilateral pulmonary airspace disease. These results will be called to the ordering clinician or representative by the Radiologist Assistant, and communication documented in the PACS or zVision Dashboard. Electronically Signed   By: Marlaine Hind M.D.   On: May 01, 2019 06:20    Procedures Procedure Name: Intubation Date/Time: 01-May-2019 7:22 AM Performed by: Merryl Hacker, MD Oxygen Delivery Method: Ambu bag Laryngoscope Size: Mac and 4 Grade View: Grade II Tube size: 7.5 mm Number of attempts: 1 Placement Confirmation: ETT inserted through vocal cords under direct vision and Positive ETCO2 Secured at: 26 cm Tube secured with: ETT holder Comments: Patient intubated without drugs during CPR arrest    .Central Line  Date/Time: May 01, 2019 7:23 AM Performed by: Merryl Hacker, MD Authorized by: Merryl Hacker, MD   Consent:    Consent obtained:  Emergent situation Pre-procedure details:    Skin preparation:  2% chlorhexidine   Skin preparation agent: Skin preparation agent completely dried prior to procedure   Procedure details:    Location:  R femoral   Site selection rationale:  Emergent   Patient position:  Flat  Procedural supplies:  Triple lumen   Catheter size:  7.5 Fr   Landmarks identified: yes     Ultrasound guidance: no     Number of attempts:  1   Successful placement: yes   Post-procedure details:    Post-procedure:  Dressing applied and line sutured   Assessment:  Blood return through all ports and free fluid flow   Patient tolerance of procedure:  Tolerated well, no  immediate complications Comments:     Emergent femoral line under nonsterile conditions placed   (including critical care time)  CRITICAL CARE Performed by: Merryl Hacker   Total critical care time: 90 minutes  Critical care time was exclusive of separately billable procedures and treating other patients.  Critical care was necessary to treat or prevent imminent or life-threatening deterioration.  Critical care was time spent personally by me on the following activities: development of treatment plan with patient and/or surrogate as well as nursing, discussions with consultants, evaluation of patient's response to treatment, examination of patient, obtaining history from patient or surrogate, ordering and performing treatments and interventions, ordering and review of laboratory studies, ordering and review of radiographic studies, pulse oximetry and re-evaluation of patient's condition.   Medications Ordered in ED Medications  EPINEPHrine (ADRENALIN) 4 mg in NS 250 mL (0.016 mg/mL) premix infusion (20 mcg/min Intravenous Rate/Dose Change 2019-05-24 0630)  sodium bicarbonate 150 mEq in dextrose 5 % 1,000 mL infusion ( Intravenous Rate/Dose Change 24-May-2019 0709)  norepinephrine (LEVOPHED) 39m in 2539mpremix infusion (40 mcg/min Intravenous Rate/Dose Change 8/09-12-2020645)  EPINEPHrine (ADRENALIN) 1 MG/10ML injection (1 mg Intravenous Given 8/12-Sep-2020546)  sodium bicarbonate injection (50 mEq Intravenous Given 04/2019-09-12549)  EPINEPHrine (ADRENALIN) 1 MG/10ML injection (1 mg Intravenous Given 04/20/11/2020550)  EPINEPHrine (ADRENALIN) 1 MG/10ML injection (1 mg Intravenous Given 04/2019-09-12558)  sodium bicarbonate injection ( Intravenous Canceled Entry 8/09-12-20615)  EPINEPHrine (ADRENALIN) 1 MG/10ML injection (1 mg Intravenous Given 8/Sep 12, 2020609)  sodium bicarbonate injection (50 mEq Intravenous Given 8/September 12, 2020610)  EPINEPHrine (ADRENALIN) 1 MG/10ML injection (1 mg Intravenous Given 8/09-12-2020619)   EPINEPHrine (ADRENALIN) 1 MG/10ML injection (1 mg Intravenous Given 8/09-12-20631)  EPINEPHrine (ADRENALIN) 1 MG/10ML injection (1 mg Intravenous Given 8/Sep 12, 202066237 sodium bicarbonate injection (50 mEq Intravenous Given 8/09-12-2020653)  calcium chloride injection (1 g Intravenous Given 04/2019/09/12651)  sodium bicarbonate injection (50 mEq Intravenous Given 8/09-12-20703)     Initial Impression / Assessment and Plan / ED Course  I have reviewed the triage vital signs and the nursing notes.  Pertinent labs & imaging results that were available during my care of the patient were reviewed by me and considered in my medical decision making (see chart for details).        Patient presents in cardiac arrest.  Suspect possible overdose.  Found down and in PEA.  After she was intubated, ROSC was achieved.  However, she had multiple subsequent arrests.  Multiple rounds of ACLS were performed.  She was started on an epinephrine, Levophed, and bicarb drip.  She ultimately lost pulses a total of 4 times.  With return of pulses, EKG without any obvious toxidrome picture.  Venous pH is 6.7 PCO2 of 84.  Critical care consulted and at the bedside.  I placed an emergent dirty right femoral line given that she was having intermittent frequent CPR.  I attempted to call the number listed in her chart.  I am unable to contact family at this time.  7:34 AM  Time of death 7:29 AM.  Critical care is at the bedside.  Final rhythm PEA arrest.  8:15 AM  Patient's father, Kamalei Roeder, called.  Informed of patient death.    Final Clinical Impressions(s) / ED Diagnoses   Final diagnoses:  Cardiac arrest [facility])    ED Discharge Orders    None       Agastya Meister, Barbette Hair, MD 05-21-2019 [id number]    Merryl Hacker, MD 05-21-19 0254    Merryl Hacker, MD 05/21/19 2257

## 2019-05-13 NOTE — Code Documentation (Signed)
Pulses present and strong

## 2019-05-13 NOTE — Code Documentation (Signed)
Pulse check, pulse present

## 2019-05-13 NOTE — Code Documentation (Signed)
Pulse check, pulses present, NSR  °

## 2019-05-13 NOTE — Code Documentation (Signed)
Pulses lost, CPR initiated

## 2019-05-13 NOTE — Code Documentation (Signed)
Pulse check, present, ST

## 2019-05-13 NOTE — Consult Note (Signed)
NAME:  Denise Mclaughlin, MRN:  [id number], DOB:  [birth year], LOS: 0 ADMISSION DATE:  May 15, 2019, CONSULTATION DATE:  05/15/2019 REFERRING MD:  Horton, CHIEF COMPLAINT:  PEA arrest  Brief History   PEA arrest  History of present illness   23yF with history of APAP overdose, MDD, BiPD found down in bloody emesis. She was in PEA arrest at time of EMS arrival and underwent resuscitation with intermittent but brief achievement of ROSC for a total of 1.5 hours by the time of my evaluation, no shockable rhythm. A total of 2L NS, 14 mg epinephrine had been given, 3 amps of bicarb, and addition of epinephrine and norepinephrine infusions. She was intubated periarrest in the ED and remained completely unresponsive after achievement of ROSC.   Attempts to reach out to family to update and obtain collateral by ED were unsuccessful.   Past Medical History  APAP overdose MDD BiPD  Significant Hospital Events   At 650 arrived to assess Ms. Miner. Developed worsening bradycardia and hypotension, we administered 1g CaCl, another amp of bicarb and resumed compressions ultimately for PEA arrest again. ROSC achieved after 1 round of ACLS. Set up for arterial line, increased bicarbonate gtt to 250/hr, she then unfortunately developed PEA arrest again and after consideration of length of time with poor perfusion including roughly 1.5-2 hours of ACLS in discussion with team including Dr. Dina Rich, Dr. Mariane Masters, decision made to cease resuscitative efforts. She was pronounced dead at 0729  Consults:  None  Procedures:  R femoral central line Endotracheal Intubation R axillary arterial line  Significant Diagnostic Tests:  Labs as below   Micro Data:  None  Antimicrobials:  Vancomycin and Cefepime ordered  Interim history/subjective:  na  Objective   Blood pressure (!) 72/61, pulse (!) 124, temperature (!) 95.9 F (35.5 C), resp. rate (!) 24, height 5\' 8"  (1.727 m), SpO2 (!) 85 %, not currently  breastfeeding.    Vent Mode: PRVC FiO2 (%):  [100 %] 100 % Set Rate:  [24 bmp] 24 bmp Vt Set:  [500 mL] 500 mL PEEP:  [5 cmH20] 5 cmH20 Plateau Pressure:  [29 cmH20] 29 cmH20  No intake or output data in the 24 hours ending 05/15/2019 0738 There were no vitals filed for this visit.  Examination: General: intubated, unresponsive HENT: not examined Lungs: equal chest rise, mechanical breath sounds Cardiovascular: RRR mostly Abdomen: soft, obese but nondistended Extremities: cool nonedematous Neuro: unresponsive GU: na  Resolved Hospital Problem list   na  Assessment & Plan:  # PEA cardiac arrest and death with concern for ingestion but unrevealing basic toxicologic workup.  - ED attempting to reach out to family again, notifying medical examiner  Best practice:  na  Labs   CBC: Recent Labs  Lab 05/15/19 0610 05-15-19 0614  WBC 21.6*  --   HGB 11.6* 12.9  HCT 43.7 38.0  MCV 80.3  --   PLT 295  --     Basic Metabolic Panel: Recent Labs  Lab 05-15-19 0610 15-May-2019 0614  NA 144 140  K 6.4* 6.1*  CL 101  --   CO2 16*  --   GLUCOSE 179*  --   BUN 28*  --   CREATININE 2.92*  --   CALCIUM 9.1  --    GFR: CrCl cannot be calculated (Unknown ideal weight.). Recent Labs  Lab 05-15-2019 0610  WBC 21.6*  LATICACIDVEN >11.0*    Liver Function Tests: Recent Labs  Lab 2019-05-15 0610  AST 1,924*  ALT 2,037*  ALKPHOS 147*  BILITOT 0.7  PROT 5.6*  ALBUMIN 2.5*   No results for input(s): LIPASE, AMYLASE in the last 168 hours. No results for input(s): AMMONIA in the last 168 hours.  ABG    Component Value Date/Time   HCO3 13.0 (L) June 06, 2019 0614   TCO2 16 (L) June 06, 2019 0614   ACIDBASEDEF 23.0 (H) June 06, 2019 0614   O2SAT 36.0 June 06, 2019 0614     Coagulation Profile: No results for input(s): INR, PROTIME in the last 168 hours.  Cardiac Enzymes: No results for input(s): CKTOTAL, CKMB, CKMBINDEX, TROPONINI in the last 168 hours.  HbA1C: Hgb A1c MFr  Bld  Date/Time Value Ref Range Status  10/17/2017 03:34 PM 5.1 4.8 - 5.6 % Final    Comment:             Prediabetes: 5.7 - 6.4          Diabetes: >6.4          Glycemic control for adults with diabetes: <7.0     CBG: No results for input(s): GLUCAP in the last 168 hours.  Review of Systems:   Unable to obtain  Past Medical History  She,  has a past medical history of ADHD (attention deficit hyperactivity disorder), Anxiety, Anxiety disorder of adolescence (05/21/2015), Bipolar and related disorder (HCC) (05/21/2015), Bipolar disorder (HCC), Cannabis abuse, continuous use (05/21/2015), Conduct disorder (05/21/2015), Depression, Obesity (05/21/2015), Suicidal ideation (05/21/2015), and Urinary tract infection.   Surgical History    Past Surgical History:  Procedure Laterality Date  . ABDOMINAL SURGERY     ?pyloric stenosis  . MOUTH SURGERY       Social History   reports that she quit smoking about 2 years ago. Her smoking use included cigarettes. She has never used smokeless tobacco. She reports previous drug use. Drug: Marijuana. She reports that she does not drink alcohol.   Family History   Her family history includes Depression in her mother; Hypertension in her father.   Allergies No Known Allergies   Home Medications  Prior to Admission medications   Medication Sig Start Date End Date Taking? Authorizing Provider  acetaminophen (TYLENOL) 325 MG tablet Take 650 mg by mouth every 6 (six) hours as needed for mild pain or headache.    [provider]  ferrous sulfate (FERROUSUL) 325 (65 FE) MG tablet Take 1 tablet (325 mg total) by mouth 2 (two) times daily. 02/13/18   Conan Bowensavis, Kelly M, MD  ferrous sulfate 325 (65 FE) MG tablet Take 1 tablet (325 mg total) by mouth 2 (two) times daily with a meal. 04/06/18   Arvilla MarketWallace, Catherine Lauren, DO  ibuprofen (ADVIL,MOTRIN) 600 MG tablet Take 1 tablet (600 mg total) by mouth every 6 (six) hours. 04/06/18   Arvilla MarketWallace, Catherine Lauren, DO   omeprazole (PRILOSEC) 20 MG capsule Take 1 capsule (20 mg total) by mouth 2 (two) times daily before a meal. 10/17/17   Denney, Rachelle A, CNM  ondansetron (ZOFRAN ODT) 8 MG disintegrating tablet Take 1 tablet (8 mg total) by mouth every 8 (eight) hours as needed for nausea or vomiting. Patient not taking: Reported on 05/21/2018 02/17/18   Rolm BookbinderNeill, Caroline M, CNM  Prenat-FeAsp-Meth-FA-DHA w/o A (PRENATE PIXIE) 10-0.6-0.4-200 MG CAPS Take 1 tablet by mouth daily. Patient not taking: Reported on 05/21/2018 10/17/17   Orvilla Cornwallenney, Rachelle A, CNM  Vitamin D, Ergocalciferol, (DRISDOL) 50000 units CAPS capsule Take 1 capsule (50,000 Units total) by mouth every 7 (seven) days. Patient not taking: Reported on  04/04/2018 10/26/17   Roe Coombsenney, Rachelle A, CNM     Critical care time: 30 minutes

## 2019-05-13 NOTE — Code Documentation (Signed)
Pulses lost, asystole, CPR restarted

## 2019-05-13 NOTE — Code Documentation (Signed)
Pt started on bicarb drip at 150

## 2019-05-13 NOTE — Code Documentation (Signed)
Pulses lost, PEA, CPR initiated

## 2019-05-13 NOTE — Progress Notes (Signed)
Pharmacy Antibiotic Note  Denise Mclaughlin is a 23 y.o. female admitted on 05-11-2019 s/p cardiac arrest, possible sepsis.  Pharmacy has been consulted for Vancomycin dosing.  Plan: Vancomycin 2 g IV now F/U renal function and redose as indicated  Height: 5\' 8"  (172.7 cm) IBW/kg (Calculated) : 63.9  Temp (24hrs), Avg:95.2 F (35.1 C), Min:94 F (34.4 C), Max:95.9 F (35.5 C)  Recent Labs  Lab May 11, 2019 0610  WBC 21.6*  CREATININE 2.92*  LATICACIDVEN >11.0*    CrCl cannot be calculated (Unknown ideal weight.).    No Known Allergies   Caryl Pina 2019-05-11 7:30 AM

## 2019-05-13 NOTE — Code Documentation (Signed)
CCM at bedside 

## 2019-05-13 DEATH — deceased

## 2019-09-06 IMAGING — DX PORTABLE CHEST - 1 VIEW
1 series · 1 of 1 positions shown · non-contrast
Comparison: 08/23/2015

CLINICAL DATA: Found unresponsive. S/p CPR. Hematemesis.

EXAM:
PORTABLE CHEST 1 VIEW

[chest ap]
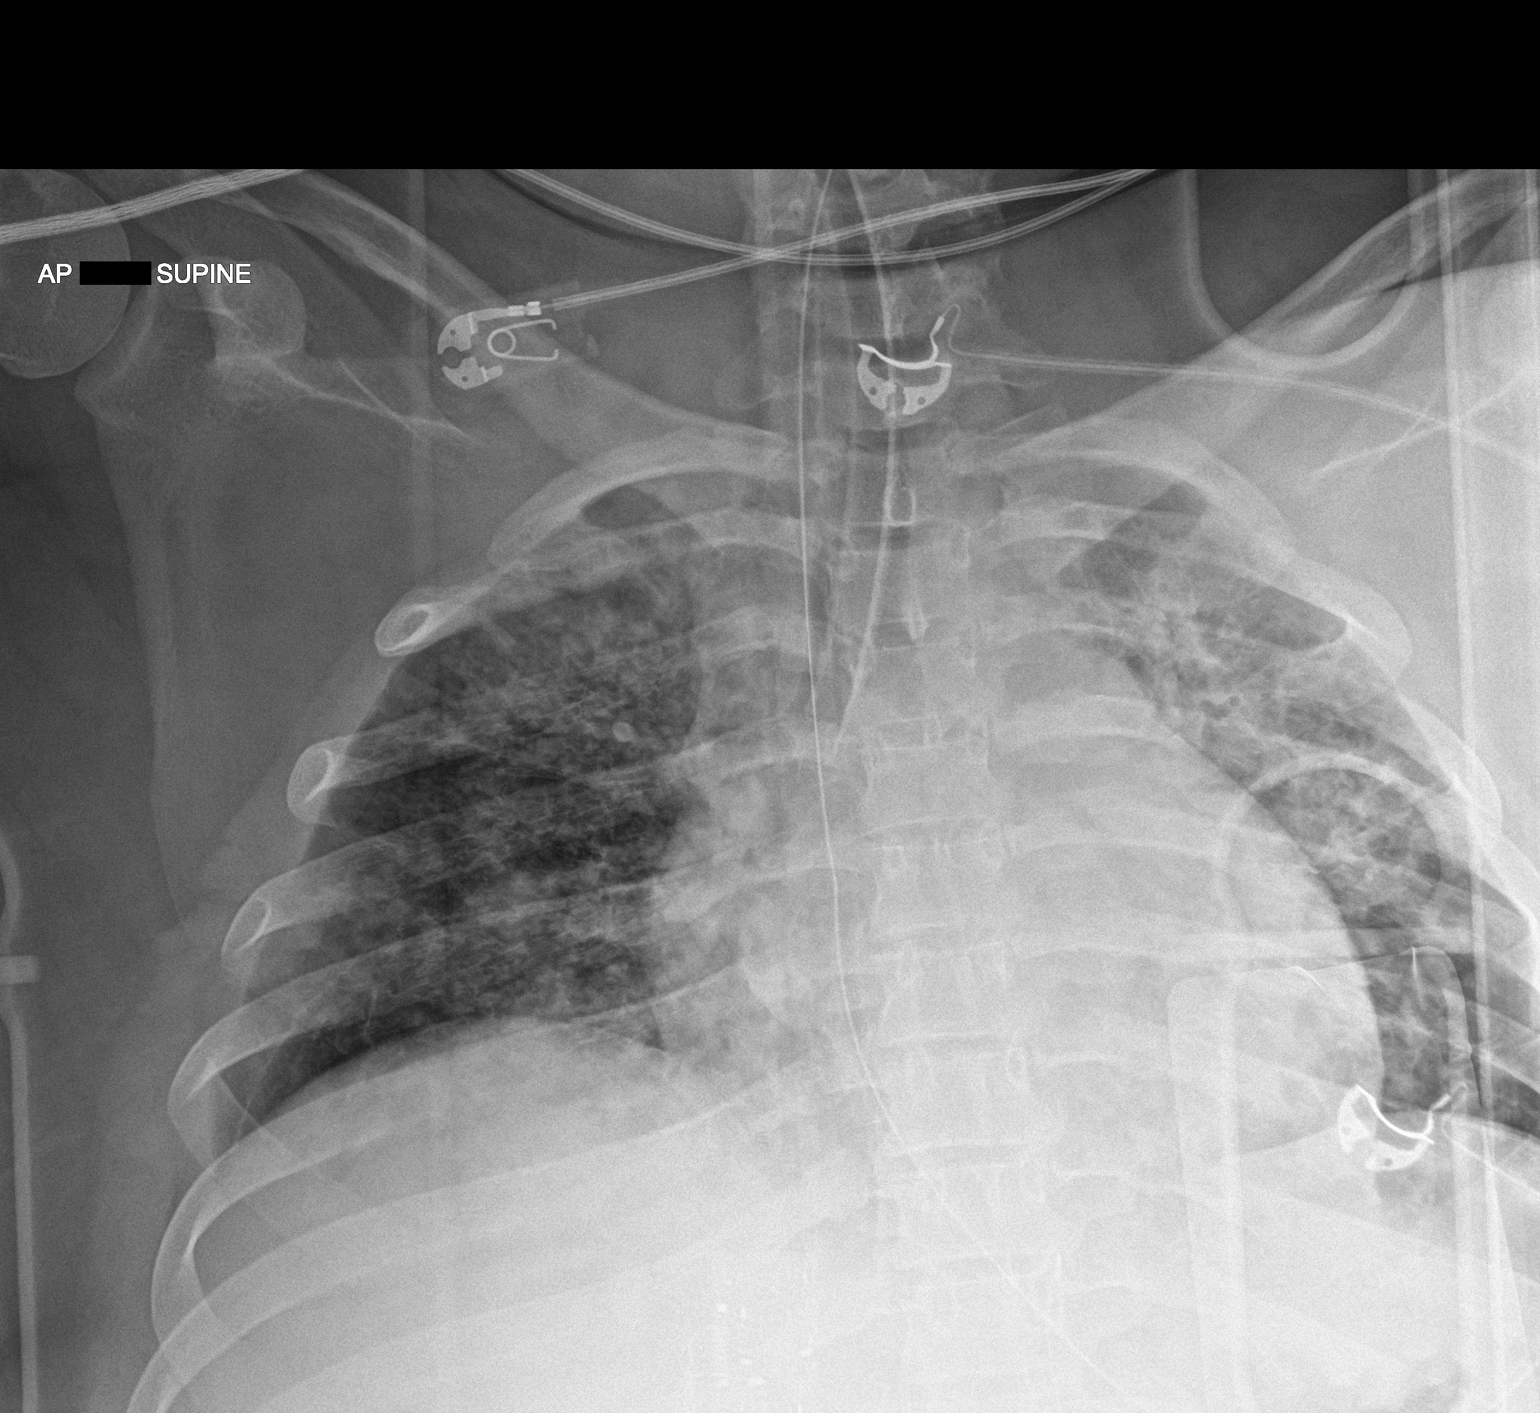

[1 of 1 positions shown; findings below may reference images not displayed]

FINDINGS: Endotracheal tube tip is in the right mainstem bronchus,
approximately 2 cm below the carina. A nasogastric tube is seen
entering the stomach.

Patchy bilateral airspace disease is seen throughout both lungs. Low
lung volumes noted. No pneumothorax or pleural effusion visualized.
Heart size is normal.
IMPRESSION: 1. Endotracheal tube tip in right mainstem bronchus.
2. Bilateral pulmonary airspace disease.

These results will be called to the ordering clinician or
representative by the Radiologist Assistant, and communication
documented in the PACS or zVision Dashboard.
